# Patient Record
Sex: Male | Born: 1985 | Race: White | Hispanic: No | Smoking: Current every day smoker
Health system: Southern US, Community
[De-identification: ages and names within clinical notes are randomized; demographics above are authoritative.]

## PROBLEM LIST (undated history)

## (undated) ENCOUNTER — Ambulatory Visit

## (undated) DIAGNOSIS — K219 Gastro-esophageal reflux disease without esophagitis: Secondary | ICD-10-CM

## (undated) DIAGNOSIS — S42022A Displaced fracture of shaft of left clavicle, initial encounter for closed fracture: Secondary | ICD-10-CM

## (undated) DIAGNOSIS — F419 Anxiety disorder, unspecified: Secondary | ICD-10-CM

## (undated) DIAGNOSIS — F1290 Cannabis use, unspecified, uncomplicated: Secondary | ICD-10-CM

## (undated) DIAGNOSIS — F32A Depression, unspecified: Secondary | ICD-10-CM

## (undated) DIAGNOSIS — F17200 Nicotine dependence, unspecified, uncomplicated: Secondary | ICD-10-CM

## (undated) DIAGNOSIS — F4310 Post-traumatic stress disorder, unspecified: Secondary | ICD-10-CM

## (undated) HISTORY — PX: HAND SURGERY: SHX662

## (undated) HISTORY — DX: Displaced fracture of shaft of left clavicle, initial encounter for closed fracture: S42.022A

## (undated) HISTORY — PX: WISDOM TOOTH EXTRACTION: SHX21

---
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
## 2005-04-12 ENCOUNTER — Emergency Department (HOSPITAL_COMMUNITY): Admission: EM | Admit: 2005-04-12 | Discharge: 2005-04-12 | Payer: Self-pay | Admitting: Emergency Medicine

## 2005-09-05 ENCOUNTER — Emergency Department (HOSPITAL_COMMUNITY): Admission: EM | Admit: 2005-09-05 | Discharge: 2005-09-05 | Payer: Self-pay | Admitting: *Deleted

## 2005-12-30 ENCOUNTER — Emergency Department (HOSPITAL_COMMUNITY): Admission: EM | Admit: 2005-12-30 | Discharge: 2005-12-31 | Payer: Self-pay | Admitting: Emergency Medicine

## 2006-01-05 ENCOUNTER — Emergency Department (HOSPITAL_COMMUNITY): Admission: EM | Admit: 2006-01-05 | Discharge: 2006-01-05 | Payer: Self-pay | Admitting: Emergency Medicine

## 2006-01-16 ENCOUNTER — Emergency Department (HOSPITAL_COMMUNITY): Admission: EM | Admit: 2006-01-16 | Discharge: 2006-01-16 | Payer: Self-pay | Admitting: Emergency Medicine

## 2006-07-10 ENCOUNTER — Emergency Department (HOSPITAL_COMMUNITY): Admission: EM | Admit: 2006-07-10 | Discharge: 2006-07-10 | Payer: Self-pay | Admitting: Emergency Medicine

## 2007-01-08 ENCOUNTER — Emergency Department (HOSPITAL_COMMUNITY): Admission: EM | Admit: 2007-01-08 | Discharge: 2007-01-08 | Payer: Self-pay | Admitting: Emergency Medicine

## 2007-01-11 ENCOUNTER — Ambulatory Visit (HOSPITAL_COMMUNITY): Admission: RE | Admit: 2007-01-11 | Discharge: 2007-01-11 | Payer: Self-pay | Admitting: *Deleted

## 2008-01-26 ENCOUNTER — Emergency Department (HOSPITAL_COMMUNITY): Admission: EM | Admit: 2008-01-26 | Discharge: 2008-01-26 | Payer: Self-pay | Admitting: Emergency Medicine

## 2008-02-06 ENCOUNTER — Emergency Department (HOSPITAL_COMMUNITY): Admission: EM | Admit: 2008-02-06 | Discharge: 2008-02-06 | Payer: Self-pay | Admitting: Emergency Medicine

## 2008-03-28 ENCOUNTER — Emergency Department (HOSPITAL_COMMUNITY): Admission: EM | Admit: 2008-03-28 | Discharge: 2008-03-28 | Payer: Self-pay | Admitting: Emergency Medicine

## 2008-03-30 ENCOUNTER — Emergency Department (HOSPITAL_COMMUNITY): Admission: EM | Admit: 2008-03-30 | Discharge: 2008-03-30 | Payer: Self-pay | Admitting: Emergency Medicine

## 2008-07-21 ENCOUNTER — Emergency Department (HOSPITAL_COMMUNITY): Admission: EM | Admit: 2008-07-21 | Discharge: 2008-07-21 | Payer: Self-pay | Admitting: Emergency Medicine

## 2008-12-03 ENCOUNTER — Emergency Department (HOSPITAL_COMMUNITY): Admission: EM | Admit: 2008-12-03 | Discharge: 2008-12-03 | Payer: Self-pay | Admitting: Emergency Medicine

## 2008-12-18 ENCOUNTER — Emergency Department (HOSPITAL_COMMUNITY): Admission: EM | Admit: 2008-12-18 | Discharge: 2008-12-18 | Payer: Self-pay | Admitting: Emergency Medicine

## 2010-02-23 IMAGING — CR DG HAND COMPLETE 3+V*R*
3 series · 3 of 3 positions shown · non-contrast
Comparison: None

CLINICAL DATA: Right hand injury.

RIGHT HAND - COMPLETE 3+ VIEW

[x hand ap right]
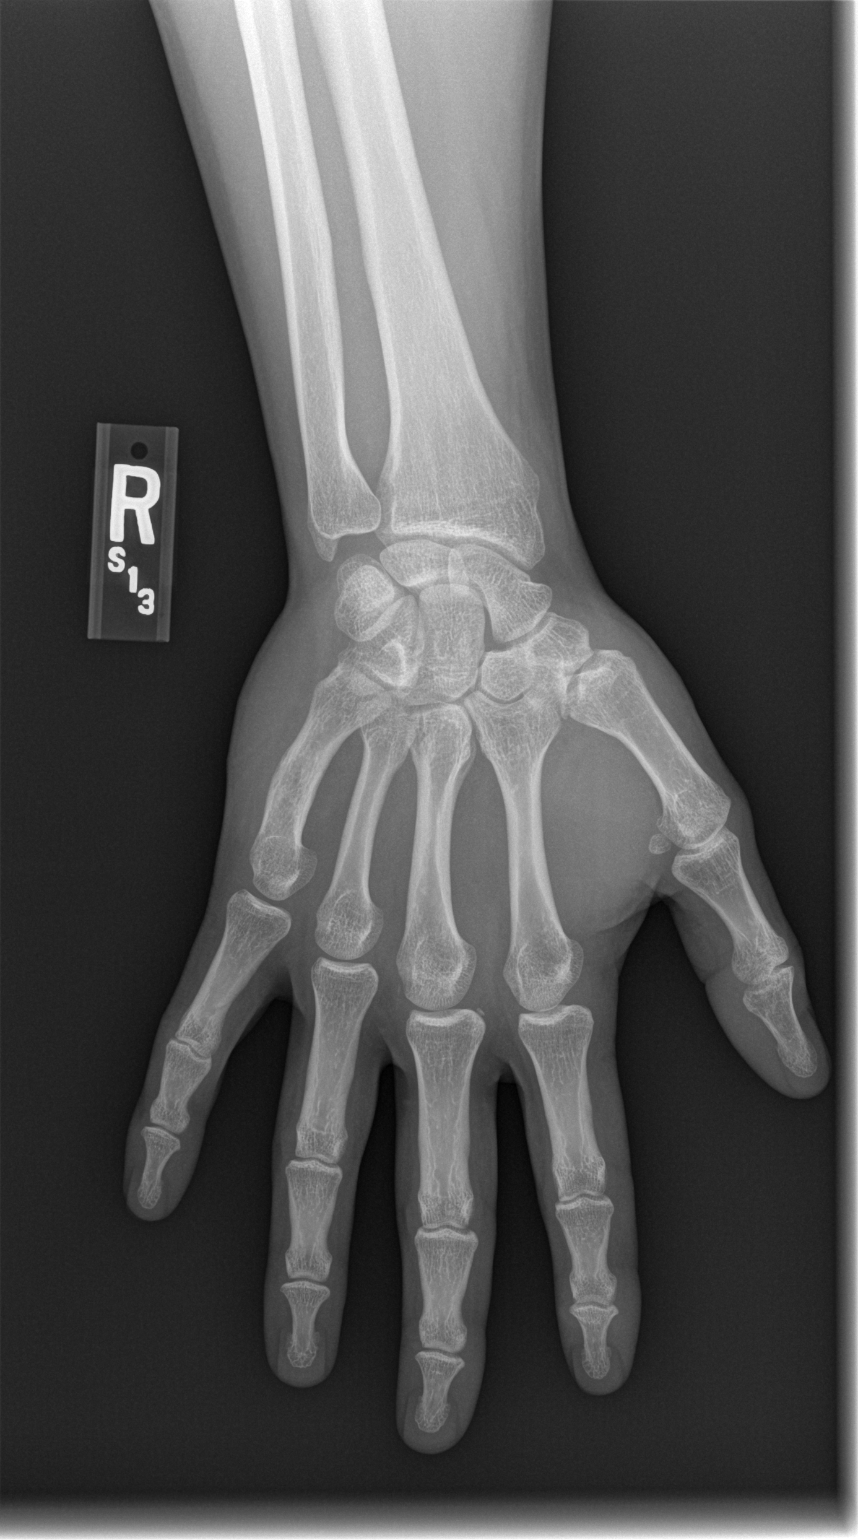

[x hand oblique right]
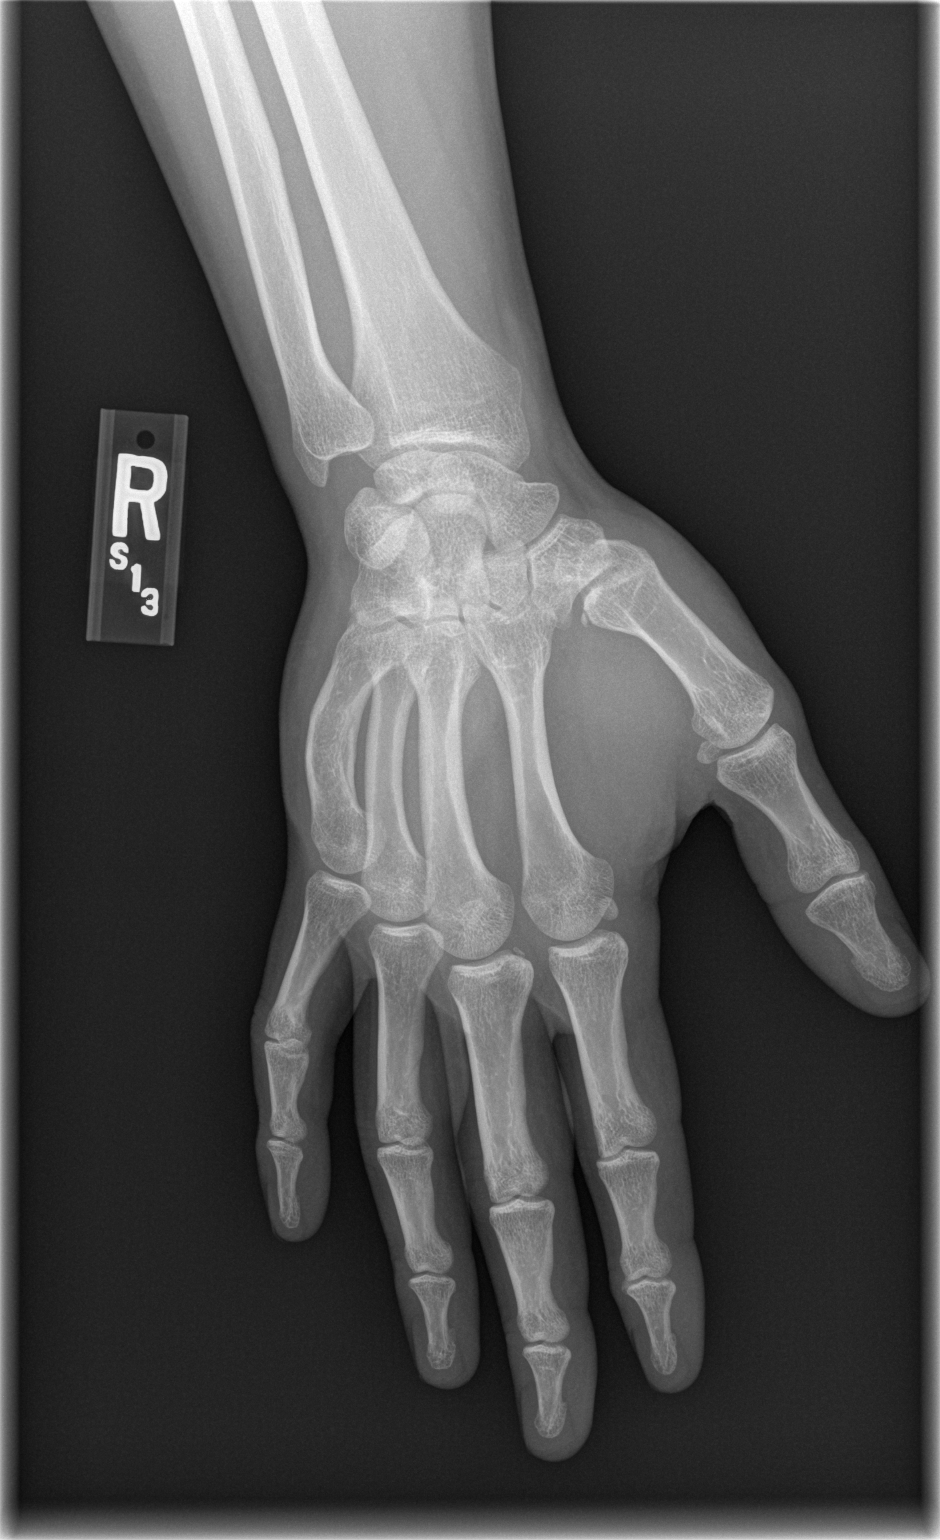

[x hand lat right]
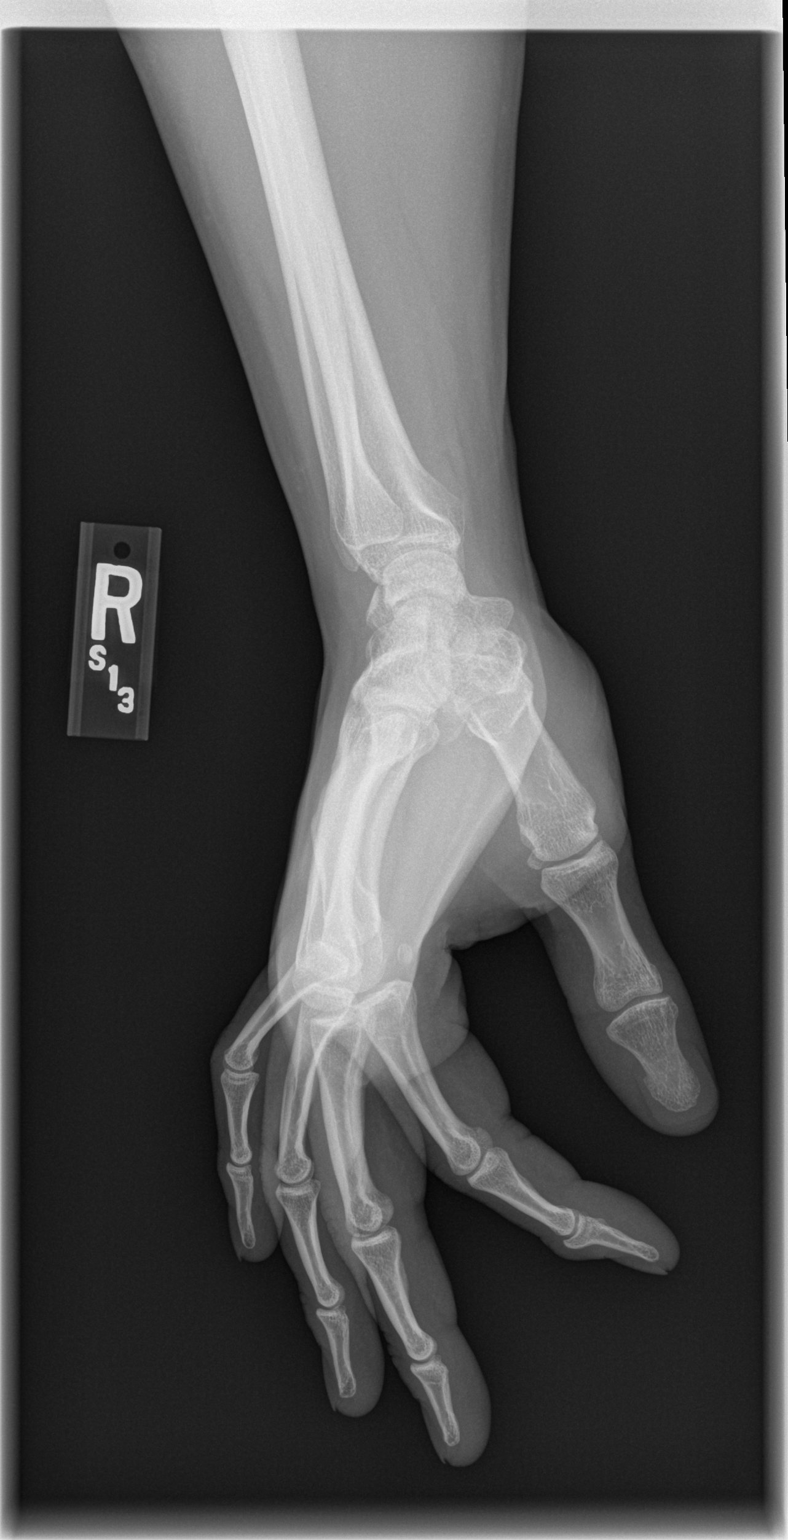

[3 of 3 positions shown; findings below may reference images not displayed]

FINDINGS: There is a remote healed fifth metacarpal fracture with
mild dorsal angulation.  The joint spaces are maintained.  There is
a small bony density at the base of the first metacarpal which it
is likely an avulsion fracture.  Recommend correlation any pain or
tenderness at the base of the thumb.  There is also a density at
the base of the proximal phalanx of the middle finger on the radial
side but this is very smooth in appearance and may be an old
avulsion injury or unfused secondary ossification center.  The
carpal bones are intact.
IMPRESSION: 1.  Avulsion fracture at the base of the first metacarpal.
2.  Remote healed fifth metacarpal fracture.

## 2010-10-07 ENCOUNTER — Emergency Department (HOSPITAL_COMMUNITY)
Admission: EM | Admit: 2010-10-07 | Discharge: 2010-10-07 | Disposition: A | Discharge status: Home / Self Care | Payer: Self-pay | Attending: Emergency Medicine | Admitting: Emergency Medicine

## 2010-10-07 ENCOUNTER — Emergency Department (HOSPITAL_COMMUNITY): Payer: Self-pay

## 2010-10-07 DIAGNOSIS — S8000XA Contusion of unspecified knee, initial encounter: Secondary | ICD-10-CM | POA: Insufficient documentation

## 2010-10-07 DIAGNOSIS — M25569 Pain in unspecified knee: Secondary | ICD-10-CM | POA: Insufficient documentation

## 2010-10-07 DIAGNOSIS — M79609 Pain in unspecified limb: Secondary | ICD-10-CM | POA: Insufficient documentation

## 2010-10-07 DIAGNOSIS — W1809XA Striking against other object with subsequent fall, initial encounter: Secondary | ICD-10-CM | POA: Insufficient documentation

## 2010-10-11 ENCOUNTER — Emergency Department (HOSPITAL_COMMUNITY)
Admission: EM | Admit: 2010-10-11 | Discharge: 2010-10-12 | Disposition: A | Discharge status: Home / Self Care | Payer: Self-pay | Attending: Emergency Medicine | Admitting: Emergency Medicine

## 2010-10-11 ENCOUNTER — Emergency Department (HOSPITAL_COMMUNITY): Payer: Self-pay

## 2010-10-11 DIAGNOSIS — S8000XA Contusion of unspecified knee, initial encounter: Secondary | ICD-10-CM | POA: Insufficient documentation

## 2010-10-11 DIAGNOSIS — W1809XA Striking against other object with subsequent fall, initial encounter: Secondary | ICD-10-CM | POA: Insufficient documentation

## 2010-10-11 DIAGNOSIS — IMO0002 Reserved for concepts with insufficient information to code with codable children: Secondary | ICD-10-CM | POA: Insufficient documentation

## 2010-11-03 NOTE — Op Note (Signed)
[name], [name]            ACCOUNT NO.:  [id number]   MEDICAL RECORD NO.:  [id number]          PATIENT TYPE:  AMB   LOCATION:  SDS                          FACILITY:  [facility]   PHYSICIAN:  Tennis Must Meyerdierks, M.D.DATE OF BIRTH:  18-Sep-1985   DATE OF PROCEDURE:  01/11/2007  DATE OF DISCHARGE:                               OPERATIVE REPORT   PREOPERATIVE DIAGNOSIS:  Bennett fracture, right thumb.   POSTOPERATIVE DIAGNOSIS:  Bennett fracture, right thumb.   PROCEDURE:  Closed reduction and percutaneous pinning of Bennett  fracture, right thumb.   SURGEON:  Lowell Bouton, M.D.   ANESTHESIA:  General.   OPERATIVE FINDINGS:  The patient had a minimally displaced fracture at  the base of the first metacarpal.  It was an intra-articular Bennett  fracture.   PROCEDURE IN DETAIL:  Under general anesthesia, the right hand was  prepped and draped in the usual fashion and the mini C-arm was draped.  The right hand was placed on the C-arm and a 0.45 K-wire was placed in  the proximal end of the first metacarpal.  The fracture was then reduced  and the K-wire was placed across the carpometacarpal joint.  X-rays  showed good position of the fracture.  A second 0.45 K-wire was then  placed adjacent to the first to prevent rotation and was placed across  the [facility] joint.  The pins were bent over and left protruding from the  skin.  Sterile dressings were applied followed by a thumb spica splint.  The patient tolerated the procedure well and went to the recovery room  awake in stable condition.      Lowell Bouton, M.D.  Electronically Signed     EMM/MEDQ  D:  01/11/2007  T:  01/11/2007  Job:  [id number]

## 2011-01-01 ENCOUNTER — Emergency Department (HOSPITAL_COMMUNITY)
Admission: EM | Admit: 2011-01-01 | Discharge: 2011-01-01 | Disposition: A | Discharge status: Home / Self Care | Payer: Self-pay | Attending: Emergency Medicine | Admitting: Emergency Medicine

## 2011-01-01 DIAGNOSIS — W268XXA Contact with other sharp object(s), not elsewhere classified, initial encounter: Secondary | ICD-10-CM | POA: Insufficient documentation

## 2011-01-01 DIAGNOSIS — M7989 Other specified soft tissue disorders: Secondary | ICD-10-CM | POA: Insufficient documentation

## 2011-01-01 DIAGNOSIS — S61409A Unspecified open wound of unspecified hand, initial encounter: Secondary | ICD-10-CM | POA: Insufficient documentation

## 2011-01-09 ENCOUNTER — Emergency Department (HOSPITAL_COMMUNITY)
Admission: EM | Admit: 2011-01-09 | Discharge: 2011-01-09 | Disposition: A | Discharge status: Home / Self Care | Payer: Self-pay | Attending: Emergency Medicine | Admitting: Emergency Medicine

## 2011-01-09 DIAGNOSIS — Z4802 Encounter for removal of sutures: Secondary | ICD-10-CM | POA: Insufficient documentation

## 2011-04-05 LAB — CBC
Hemoglobin: 13.5
RBC: 4.53

## 2011-11-29 ENCOUNTER — Emergency Department (HOSPITAL_COMMUNITY): Payer: Self-pay

## 2011-11-29 ENCOUNTER — Encounter (HOSPITAL_COMMUNITY): Payer: Self-pay | Admitting: Emergency Medicine

## 2011-11-29 ENCOUNTER — Emergency Department (HOSPITAL_COMMUNITY)
Admission: EM | Admit: 2011-11-29 | Discharge: 2011-11-29 | Disposition: A | Discharge status: Home / Self Care | Payer: Self-pay | Attending: Emergency Medicine | Admitting: Emergency Medicine

## 2011-11-29 DIAGNOSIS — J3489 Other specified disorders of nose and nasal sinuses: Secondary | ICD-10-CM | POA: Insufficient documentation

## 2011-11-29 DIAGNOSIS — J329 Chronic sinusitis, unspecified: Secondary | ICD-10-CM

## 2011-11-29 DIAGNOSIS — R070 Pain in throat: Secondary | ICD-10-CM | POA: Insufficient documentation

## 2011-11-29 DIAGNOSIS — R062 Wheezing: Secondary | ICD-10-CM | POA: Insufficient documentation

## 2011-11-29 DIAGNOSIS — R51 Headache: Secondary | ICD-10-CM | POA: Insufficient documentation

## 2011-11-29 DIAGNOSIS — IMO0001 Reserved for inherently not codable concepts without codable children: Secondary | ICD-10-CM | POA: Insufficient documentation

## 2011-11-29 DIAGNOSIS — R05 Cough: Secondary | ICD-10-CM | POA: Insufficient documentation

## 2011-11-29 DIAGNOSIS — R059 Cough, unspecified: Secondary | ICD-10-CM | POA: Insufficient documentation

## 2011-11-29 DIAGNOSIS — J40 Bronchitis, not specified as acute or chronic: Secondary | ICD-10-CM

## 2011-11-29 MED ORDER — IPRATROPIUM BROMIDE 0.02 % IN SOLN: 0.5000 mg | Freq: Once | RESPIRATORY_TRACT | Status: DC

## 2011-11-29 MED ORDER — FLUTICASONE PROPIONATE 50 MCG/ACT NA SUSP: 2.0000 | Freq: Every day | NASAL | Status: DC

## 2011-11-29 MED ORDER — OXYMETAZOLINE HCL 0.05 % NA SOLN
1.0000 | Freq: Once | NASAL | Status: AC
  Administered 2011-11-29: 1 via NASAL
  Filled 2011-11-29: qty 15

## 2011-11-29 MED ORDER — ALBUTEROL SULFATE (5 MG/ML) 0.5% IN NEBU
5.0000 mg | INHALATION_SOLUTION | Freq: Once | RESPIRATORY_TRACT | Status: AC
  Administered 2011-11-29: 5 mg via RESPIRATORY_TRACT
  Filled 2011-11-29: qty 1

## 2011-11-29 MED ORDER — PREDNISONE 20 MG PO TABS
60.0000 mg | ORAL_TABLET | Freq: Once | ORAL | Status: AC
  Administered 2011-11-29: 60 mg via ORAL
  Filled 2011-11-29: qty 3

## 2011-11-29 MED ORDER — ALBUTEROL SULFATE (5 MG/ML) 0.5% IN NEBU: 5.0000 mg | INHALATION_SOLUTION | Freq: Once | RESPIRATORY_TRACT | Status: DC

## 2011-11-29 MED ORDER — ALBUTEROL SULFATE HFA 108 (90 BASE) MCG/ACT IN AERS
2.0000 | INHALATION_SPRAY | Freq: Once | RESPIRATORY_TRACT | Status: AC
  Administered 2011-11-29: 2 via RESPIRATORY_TRACT
  Filled 2011-11-29: qty 6.7

## 2011-11-29 MED ORDER — PREDNISONE (PAK) 10 MG PO TABS: ORAL_TABLET | ORAL | Status: AC

## 2011-11-29 NOTE — Discharge Instructions (Signed)
Albuterol inhaler:  1-2 puffs every 4-6 hours  Ibuprofen: up to 800mg  every 6-8 hours with food for fever, chills and body pains  How to clear your sinuses:  Use Afrin (oxymetazoline) (and over-the-counter nasal decongestant spray): 1-2 squirts in each nostril. Wait 10 minutes (while using either warm compresses to face, a humidifier, or taking a hot shower) Then do 2 squirts of fluticasone (prescription)  in each nostril. Do this for 1-2 weeks and that should help significantly with sinus congestion.     Bronchitis Bronchitis is the body's way of reacting to injury and/or infection (inflammation) of the bronchi. Bronchi are the air tubes that extend from the windpipe into the lungs. If the inflammation becomes severe, it may cause shortness of breath. CAUSES  Inflammation may be caused by:  A virus.   Germs (bacteria).   Dust.   Allergens.   Pollutants and many other irritants.  The cells lining the bronchial tree are covered with tiny hairs (cilia). These constantly beat upward, away from the lungs, toward the mouth. This keeps the lungs free of pollutants. When these cells become too irritated and are unable to do their job, mucus begins to develop. This causes the characteristic cough of bronchitis. The cough clears the lungs when the cilia are unable to do their job. Without either of these protective mechanisms, the mucus would settle in the lungs. Then you would develop pneumonia. Smoking is a common cause of bronchitis and can contribute to pneumonia. Stopping this habit is the single most important thing you can do to help yourself. TREATMENT   Medicines that open up your airways make it easier to breathe. Your caregiver may also recommend or prescribe an expectorant. It will loosen the mucus to be coughed up. Only take over-the-counter or prescription medicines for pain, discomfort, or fever as directed by your caregiver.   Removing whatever causes the problem (smoking, for  example) is critical to preventing the problem from getting worse.   Cough suppressants may be prescribed for relief of cough symptoms.   Inhaled medicines may be prescribed to help with symptoms now and to help prevent problems from returning.   For those with recurrent (chronic) bronchitis, there may be a need for steroid medicines.  SEEK IMMEDIATE MEDICAL CARE IF:   During treatment, you develop more pus-like mucus (purulent sputum).   You have a fever.   Your baby is older than 3 months with a rectal temperature of 102 F (38.9 C) or higher.   Your baby is 59 months old or younger with a rectal temperature of 100.4 F (38 C) or higher.   You become progressively more ill.   You have increased difficulty breathing, wheezing, or shortness of breath.  It is necessary to seek immediate medical care if you are elderly or sick from any other disease. MAKE SURE YOU:   Understand these instructions.   Will watch your condition.   Will get help right away if you are not doing well or get worse.  Document Released: 06/07/2005 Document Revised: 05/27/2011 Document Reviewed: 04/16/2008 [facility] Patient Information 2012 [place], [place].  Sinusitis Sinuses are air pockets within the bones of your face. The growth of bacteria within a sinus leads to infection. The infection prevents the sinuses from draining. This infection is called sinusitis. SYMPTOMS  There will be different areas of pain depending on which sinuses have become infected.  The maxillary sinuses often produce pain beneath the eyes.   Frontal sinusitis may cause pain  in the middle of the forehead and above the eyes.  Other problems (symptoms) include:  Toothaches.   Colored, pus-like (purulent) drainage from the nose.   Swelling, warmth, and tenderness over the sinus areas may be signs of infection.  TREATMENT  Sinusitis is most often determined by an exam. A majority of infections are caused by a virus and  usually require symptomatic treatment. If you continue to have symptoms including a fever of 100.4 or higher for > 2 weeks please return to the for evaluation of a possible bacterial infection   HOME CARE INSTRUCTIONS   Only take over-the-counter or prescription medicines for pain, discomfort, or fever as directed by your caregiver.   Drink extra fluids. Fluids help thin the mucus so your sinuses can drain more easily.   Applying either moist heat or ice packs to the sinus areas may help relieve discomfort.   Use saline nasal sprays to help moisten your sinuses. The sprays can be found at your local drugstore.  SEEK IMMEDIATE MEDICAL CARE IF:  You have a fever.   You have increasing pain, severe headaches, or toothache.   You have nausea, vomiting, or drowsiness.   You develop unusual swelling around the face or trouble seeing.  MAKE SURE YOU:   Understand these instructions.   Will watch your condition.   Will get help right away if you are not doing well or get worse.  Document Released: 06/07/2005 Document Revised: 05/27/2011 Document Reviewed: 01/04/2007 [facility] Patient Information 2012 [place], [place].  Antibiotic Nonuse  Your caregiver felt that the infection or problem was not one that would be helped with an antibiotic. Infections may be caused by viruses or bacteria. Only a caregiver can tell which one of these is the likely cause of an illness. A cold is the most common cause of infection in both adults and children. A cold is a virus. Antibiotic treatment will have no effect on a viral infection. Viruses can lead to many lost days of work caring for sick children and many missed days of school. Children may catch as many as 10 "colds" or "flus" per year during which they can be tearful, cranky, and uncomfortable. The goal of treating a virus is aimed at keeping the ill person comfortable. Antibiotics are medications used to help the body fight bacterial infections. There  are relatively few types of bacteria that cause infections but there are hundreds of viruses. While both viruses and bacteria cause infection they are very different types of germs. A viral infection will typically go away by itself within 7 to 10 days. Bacterial infections may spread or get worse without antibiotic treatment. Examples of bacterial infections are:  Sore throats (like strep throat or tonsillitis).   Infection in the lung (pneumonia).   Ear and skin infections.  Examples of viral infections are:  Colds or flus.   Most coughs and bronchitis.   Sore throats not caused by Strep.   Runny noses.  It is often best not to take an antibiotic when a viral infection is the cause of the problem. Antibiotics can kill off the helpful bacteria that we have inside our body and allow harmful bacteria to start growing. Antibiotics can cause side effects such as allergies, nausea, and diarrhea without helping to improve the symptoms of the viral infection. Additionally, repeated uses of antibiotics can cause bacteria inside of our body to become resistant. That resistance can be passed onto harmful bacterial. The next time you have an infection  it may be harder to treat if antibiotics are used when they are not needed. Not treating with antibiotics allows our own immune system to develop and take care of infections more efficiently. Also, antibiotics will work better for [place] when they are prescribed for bacterial infections. Treatments for a child that is ill may include:  Give extra fluids throughout the day to stay hydrated.   Get plenty of rest.   Only give your child over-the-counter or prescription medicines for pain, discomfort, or fever as directed by your caregiver.   The use of a cool mist humidifier may help stuffy noses.   Cold medications if suggested by your caregiver.  Your caregiver may decide to start you on an antibiotic if:  The problem you were seen for today continues  for a longer length of time than expected.   You develop a secondary bacterial infection.  SEEK MEDICAL CARE IF:  Fever lasts longer than 5 days.   Symptoms continue to get worse after 5 to 7 days or become severe.   Difficulty in breathing develops.   Signs of dehydration develop (poor drinking, rare urinating, dark colored urine).   Changes in behavior or worsening tiredness (listlessness or lethargy).  Document Released: 08/16/2001 Document Revised: 05/27/2011 Document Reviewed: 02/12/2009 [facility] Patient Information 2012 [place], [place].

## 2011-11-29 NOTE — ED Notes (Signed)
Pt alert, nad, c/o URI ss, onset several days ago, resp even unlabored, skin pwd

## 2011-11-29 NOTE — ED Provider Notes (Signed)
History     CSN: [id number]  Arrival date & time 11/29/11  0555   First MD Initiated Contact with Patient 11/29/11 0616      6:26 AM HPI Reports 3 days of URI sxs. States no improvement with rest. Symptoms include nasal congestion, sinus pressure, cough, wheezing, sore throat, chills and myalgias. Denies fever, CP, SOB. Denies sick contacts, foreign travel or h/o asthma. Patient is a 26 y.o. male presenting with URI. The history is provided by the patient.  URI The primary symptoms include headaches, sore throat, cough, wheezing and myalgias. Primary symptoms do not include fever, ear pain, abdominal pain, nausea or vomiting. Episode onset: 3 days ago. This is a new problem. The problem has been gradually worsening.  The headache is not associated with neck stiffness or weakness.  The myalgias are not associated with weakness.  Symptoms associated with the illness include chills, facial pain, sinus pressure, congestion and rhinorrhea.    History reviewed. No pertinent past medical history.  History reviewed. No pertinent past surgical history.  No family history on file.  History  Substance Use Topics  . Smoking status: Current Everyday Smoker -- 1.0 packs/day    Types: Cigarettes  . Smokeless tobacco: Not on file  . Alcohol Use: No      Review of Systems  Constitutional: Positive for chills. Negative for fever.  HENT: Positive for congestion, sore throat, rhinorrhea and sinus pressure. Negative for ear pain, neck pain and neck stiffness.   Respiratory: Positive for cough and wheezing. Negative for shortness of breath.   Cardiovascular: Negative for chest pain.  Gastrointestinal: Negative for nausea, vomiting, abdominal pain and diarrhea.  Genitourinary: Negative for dysuria, hematuria and flank pain.  Musculoskeletal: Positive for myalgias.  Neurological: Positive for headaches. Negative for dizziness, speech difficulty, weakness and numbness.  All other systems reviewed  and are negative.    Allergies  Peanut-containing drug products  Home Medications  No current outpatient prescriptions on file.  BP 134/72  Pulse 81  Temp(Src) 97.8 F (36.6 C) (Oral)  Resp 16  Wt 180 lb (81.647 kg)  SpO2 94%  Physical Exam  Vitals reviewed. Constitutional: He is oriented to person, place, and time. He appears well-developed and well-nourished.  HENT:  Head: Normocephalic and atraumatic.  Right Ear: Tympanic membrane, external ear and ear canal normal.  Left Ear: Tympanic membrane, external ear and ear canal normal.  Nose: No mucosal edema or rhinorrhea. Right sinus exhibits maxillary sinus tenderness and frontal sinus tenderness. Left sinus exhibits maxillary sinus tenderness and frontal sinus tenderness.  Mouth/Throat: Oropharynx is clear and moist. No oropharyngeal exudate.  Eyes: Conjunctivae are normal. Pupils are equal, round, and reactive to light. Right eye exhibits no discharge. Left eye exhibits no discharge.  Neck: Normal range of motion. Neck supple.  Cardiovascular: Normal rate, regular rhythm and normal heart sounds.  Exam reveals no gallop and no friction rub.   No murmur heard. Pulmonary/Chest: Effort normal. He has wheezes (Diffuse left lung  and RLL).  Lymphadenopathy:    He has no cervical adenopathy.  Neurological: He is alert and oriented to person, place, and time.  Skin: Skin is warm and dry. No rash noted. No erythema. No pallor.  Psychiatric: He has a normal mood and affect. His behavior is normal.    ED Course  Procedures   Dg Chest 2 View  11/29/2011  *RADIOLOGY REPORT*  Clinical Data: Productive cough, shortness of breath.  CHEST - 2 VIEW  Comparison: 12/03/2008  Findings: Lungs are clear. No pleural effusion or pneumothorax. The cardiomediastinal contours are within normal limits. The visualized bones and soft tissues are without significant appreciable abnormality.  IMPRESSION: No radiographic evidence of acute cardiopulmonary  process.  Original Report Authenticated By: Waneta Martins, M.D.    MDM   7:25 AM Patient reassessed. Lungs are now CTA. Reports Afrin has significantly improved nasal congestion. CXR neg. Will d/c with fluticasone and afrin regime to help clear sinuses. Also will Rx prednisone and give albuterol inhaler for Bronchitis. Advised ibuprofen for fever, chills and myalgias. Patient voices understanding and is ready for d/c      Thomasene Lot, PA-C 11/29/11 4540

## 2011-11-29 NOTE — ED Provider Notes (Signed)
Medical screening examination/treatment/procedure(s) were performed by non-physician practitioner and as supervising physician I was immediately available for consultation/collaboration.  Olivia Mackie, MD 11/29/11 [phone]

## 2012-02-23 ENCOUNTER — Emergency Department (HOSPITAL_COMMUNITY): Payer: Self-pay

## 2012-02-23 ENCOUNTER — Encounter (HOSPITAL_COMMUNITY): Payer: Self-pay | Admitting: Emergency Medicine

## 2012-02-23 ENCOUNTER — Emergency Department (HOSPITAL_COMMUNITY)
Admission: EM | Admit: 2012-02-23 | Discharge: 2012-02-23 | Disposition: A | Discharge status: Home / Self Care | Payer: Self-pay | Attending: Emergency Medicine | Admitting: Emergency Medicine

## 2012-02-23 DIAGNOSIS — F17200 Nicotine dependence, unspecified, uncomplicated: Secondary | ICD-10-CM | POA: Insufficient documentation

## 2012-02-23 DIAGNOSIS — J069 Acute upper respiratory infection, unspecified: Secondary | ICD-10-CM | POA: Insufficient documentation

## 2012-02-23 MED ORDER — IBUPROFEN 800 MG PO TABS
800.0000 mg | ORAL_TABLET | Freq: Once | ORAL | Status: AC
  Administered 2012-02-23: 800 mg via ORAL
  Filled 2012-02-23: qty 1

## 2012-02-23 MED ORDER — OXYMETAZOLINE HCL 0.05 % NA SOLN
1.0000 | Freq: Once | NASAL | Status: AC
  Administered 2012-02-23: 1 via NASAL
  Filled 2012-02-23: qty 15

## 2012-02-23 MED ORDER — ACETAMINOPHEN-CODEINE 120-12 MG/5ML PO SUSP: 5.0000 mL | Freq: Four times a day (QID) | ORAL | Status: AC | PRN

## 2012-02-23 MED ORDER — ALBUTEROL SULFATE HFA 108 (90 BASE) MCG/ACT IN AERS: 1.0000 | INHALATION_SPRAY | Freq: Four times a day (QID) | RESPIRATORY_TRACT | Status: DC | PRN

## 2012-02-23 MED ORDER — ALBUTEROL SULFATE HFA 108 (90 BASE) MCG/ACT IN AERS
2.0000 | INHALATION_SPRAY | RESPIRATORY_TRACT | Status: DC | PRN
  Administered 2012-02-23: 2 via RESPIRATORY_TRACT
  Filled 2012-02-23: qty 6.7

## 2012-02-23 MED ORDER — IBUPROFEN 800 MG PO TABS: 800.0000 mg | ORAL_TABLET | Freq: Three times a day (TID) | ORAL | Status: AC

## 2012-02-23 NOTE — ED Provider Notes (Signed)
History     CSN: [id number]  Arrival date & time 02/23/12  0405   First MD Initiated Contact with Patient 02/23/12 [phone]      Chief Complaint  Patient presents with  . Shortness of Breath    (Consider location/radiation/quality/duration/timing/severity/associated sxs/prior treatment) HPI HX per PT, sick the last few days with cough, congestion and mild sore throat, no fevers or chills, no N/V, no rash, sig other at home sick last week with the same symptoms and now has lingering cough. Taking mucinex with minimal relief, is a smoker, no wheezes. Mod in severity History reviewed. No pertinent past medical history.  Past Surgical History  Procedure Date  . Hand surgery     Family History  Problem Relation Age of Onset  . Coronary artery disease Other   . Cancer Other     History  Substance Use Topics  . Smoking status: Current Everyday Smoker -- 1.0 packs/day    Types: Cigarettes  . Smokeless tobacco: Not on file  . Alcohol Use: No      Review of Systems  Constitutional: Negative for fever and chills.  HENT: Positive for congestion. Negative for neck pain and neck stiffness.   Eyes: Negative for pain.  Respiratory: Positive for cough. Negative for shortness of breath.   Cardiovascular: Negative for chest pain.  Gastrointestinal: Negative for abdominal pain.  Genitourinary: Negative for dysuria.  Musculoskeletal: Negative for back pain.  Skin: Negative for rash.  Neurological: Negative for headaches.  All other systems reviewed and are negative.    Allergies  Peanut-containing drug products  Home Medications   Current Outpatient Rx  Name Route Sig Dispense Refill  . FLUTICASONE PROPIONATE 50 MCG/ACT NA SUSP Nasal Place 2 sprays into the nose daily.    . GUAIFENESIN ER 600 MG PO TB12 Oral Take 1,200 mg by mouth 2 (two) times daily.      BP 122/81  Pulse 78  Temp 97.9 F (36.6 C) (Oral)  SpO2 97%  Physical Exam  Constitutional: He is oriented to  person, place, and time. He appears well-developed and well-nourished.  HENT:  Head: Normocephalic and atraumatic.  Eyes: Conjunctivae and EOM are normal. Pupils are equal, round, and reactive to light.  Neck: Trachea normal. Neck supple.  Cardiovascular: Normal rate, regular rhythm, S1 normal, S2 normal and normal pulses.     No systolic murmur is present   No diastolic murmur is present  Pulses:      Radial pulses are 2+ on the right side, and 2+ on the left side.  Pulmonary/Chest: Effort normal. No stridor. No respiratory distress. He has no rhonchi.       Mildly dec bilat breath sounds  Abdominal: Soft. Normal appearance and bowel sounds are normal. There is no tenderness. There is no CVA tenderness and negative Murphy's sign.  Musculoskeletal:       BLE:s Calves nontender, no cords or erythema, negative Homans sign  Neurological: He is alert and oriented to person, place, and time. He has normal strength. No cranial nerve deficit or sensory deficit. GCS eye subscore is 4. GCS verbal subscore is 5. GCS motor subscore is 6.  Skin: Skin is warm and dry. No rash noted. He is not diaphoretic.  Psychiatric: His speech is normal.       Cooperative and appropriate    ED Course  Procedures (including critical care time)  Albuterol and afrin provided.   Pulse ox 97% RA is adequate  CXR obtained/ reviewed - no  acute process identified  Clinical URI, plan tylenol with codeine, motrin, afrin and albuterol as needed.   URI precautions verbalized as understood.   MDM    VS and Nurisng notes reviewed. CXR. Medications provided.          Sunnie Nielsen, MD 02/23/12 [phone]

## 2012-02-23 NOTE — ED Notes (Addendum)
Pt states he has been sick for 3 to 4 days  Pt states it started off with a cough and runny nose and has progressively gotten worse  Pt states now it is hard for him to breathe esp when he lays down

## 2013-11-15 ENCOUNTER — Encounter (HOSPITAL_COMMUNITY): Payer: Self-pay | Admitting: Emergency Medicine

## 2013-11-15 ENCOUNTER — Emergency Department (HOSPITAL_COMMUNITY)
Admission: EM | Admit: 2013-11-15 | Discharge: 2013-11-15 | Disposition: A | Discharge status: Home / Self Care | Payer: Self-pay | Attending: Emergency Medicine | Admitting: Emergency Medicine

## 2013-11-15 DIAGNOSIS — K0889 Other specified disorders of teeth and supporting structures: Secondary | ICD-10-CM

## 2013-11-15 DIAGNOSIS — K089 Disorder of teeth and supporting structures, unspecified: Secondary | ICD-10-CM | POA: Insufficient documentation

## 2013-11-15 DIAGNOSIS — F17200 Nicotine dependence, unspecified, uncomplicated: Secondary | ICD-10-CM | POA: Insufficient documentation

## 2013-11-15 DIAGNOSIS — IMO0002 Reserved for concepts with insufficient information to code with codable children: Secondary | ICD-10-CM | POA: Insufficient documentation

## 2013-11-15 DIAGNOSIS — K029 Dental caries, unspecified: Secondary | ICD-10-CM | POA: Insufficient documentation

## 2013-11-15 DIAGNOSIS — Z79899 Other long term (current) drug therapy: Secondary | ICD-10-CM | POA: Insufficient documentation

## 2013-11-15 MED ORDER — TRAMADOL HCL 50 MG PO TABS
50.0000 mg | ORAL_TABLET | Freq: Once | ORAL | Status: AC
  Administered 2013-11-15: 50 mg via ORAL
  Filled 2013-11-15: qty 1

## 2013-11-15 MED ORDER — TRAMADOL HCL 50 MG PO TABS: 50.0000 mg | ORAL_TABLET | Freq: Four times a day (QID) | ORAL | Status: DC | PRN

## 2013-11-15 MED ORDER — PENICILLIN V POTASSIUM 500 MG PO TABS: 500.0000 mg | ORAL_TABLET | Freq: Four times a day (QID) | ORAL | Status: AC

## 2013-11-15 NOTE — Discharge Instructions (Signed)
Dental Pain °A tooth ache may be caused by cavities (tooth decay). Cavities expose the nerve of the tooth to air and hot or cold temperatures. It may come from an infection or abscess (also called a boil or furuncle) around your tooth. It is also often caused by dental caries (tooth decay). This causes the pain you are having. °DIAGNOSIS  °Your caregiver can diagnose this problem by exam. °TREATMENT  °· If caused by an infection, it may be treated with medications which kill germs (antibiotics) and pain medications as prescribed by your caregiver. Take medications as directed. °· Only take over-the-counter or prescription medicines for pain, discomfort, or fever as directed by your caregiver. °· Whether the tooth ache today is caused by infection or dental disease, you should see your dentist as soon as possible for further care. °SEEK MEDICAL CARE IF: °The exam and treatment you received today has been provided on an emergency basis only. This is not a substitute for complete medical or dental care. If your problem worsens or new problems (symptoms) appear, and you are unable to meet with your dentist, call or return to this location. °SEEK IMMEDIATE MEDICAL CARE IF:  °· You have a fever. °· You develop redness and swelling of your face, jaw, or neck. °· You are unable to open your mouth. °· You have severe pain uncontrolled by pain medicine. °MAKE SURE YOU:  °· Understand these instructions. °· Will watch your condition. °· Will get help right away if you are not doing well or get worse. °Document Released: 06/07/2005 Document Revised: 08/30/2011 Document Reviewed: 01/24/2008 °ExitCare® Patient Information ©2014 [employer]. °  Emergency Department Resource Guide °1) Find a Doctor and Pay Out of Pocket °Although you won't have to find out who is covered by your insurance plan, it is a good idea to ask around and get recommendations. You will then need to call the office and see if the doctor you have chosen will  accept you as a new patient and what types of options they offer for patients who are self-pay. Some doctors offer discounts or will set up payment plans for their patients who do not have insurance, but you will need to ask so you aren't surprised when you get to your appointment. ° °2) Contact Your Local Health Department °Not all health departments have doctors that can see patients for sick visits, but many do, so it is worth a call to see if yours does. If you don't know where your local health department is, you can check in your phone book. The CDC also has a tool to help you locate your state's health department, and many state websites also have listings of all of their local health departments. ° °3) Find a Walk-in Clinic °If your illness is not likely to be very severe or complicated, you may want to try a walk in clinic. These are popping up all over the country in pharmacies, drugstores, and shopping centers. They're usually staffed by nurse practitioners or physician assistants that have been trained to treat common illnesses and complaints. They're usually fairly quick and inexpensive. However, if you have serious medical issues or chronic medical problems, these are probably not your best option. ° °No Primary Care Doctor: °- Call [facility] at  832-8000 - they can help you locate a primary care doctor that  accepts your insurance, provides certain services, etc. °- Physician Referral Service- 1-800-533-3463 ° °Chronic Pain Problems: °Organization           Address  Phone   Notes  °[facility]  (336) 297-2271 Patients need to be referred by their primary care doctor.  ° °Medication Assistance: °Organization         Address  Phone   Notes  °[place] [facility] [address] °[place], [place] [address] (336) 641-8030 --Must be a resident of [place] °-- Must have NO insurance coverage whatsoever (no Medicaid/ Medicare, etc.) °-- The pt.  MUST have a primary care doctor that directs their care regularly and follows them in the community °  °[facility]  (866) 331-1348   °[employer]  (888) 892-1162   ° °Agencies that provide inexpensive medical care: °Organization         Address  Phone   Notes  °[facility]  (336) 832-8035   °[facility]    (336) 832-7272   °[facility] [address] °[place], [place] [address] (336) 832-4777   °[facility] [address], °[place] (336) 271-4999   °[facility]    (336) 373-0678   °[facility]    (336) 272-1050   °[facility] ° [address], [place] Phone:  (336) 832-4444, Fax:  (336) 832-4440 Hours of Operation:  9 am - 6 pm, M-F.  Also accepts Medicaid/Medicare and self-pay.  °[facility] ° [address], [place] Phone: (336) 832-3150, Fax: (336) 832-3151. Hours of Operation:  8:30 am - 5:30 pm, M-F.  Also accepts Medicaid and self-pay.  °[facility] [place] [address], [place] Phone: (336) 878-6027   °[facility] [address], [place], [place] (336)723-1848, Ext. 123 Mondays & Thursdays: 7-9 AM.  First 15 patients are seen on a first come, first serve basis. °  ° °Medicaid-accepting [place] Providers: ° °Organization         Address  Phone   Notes  °[facility] [address], [place] (336) 641-2100 Also accepts self-pay patients.  °[facility] [address], [place] ° (336) 856-9996   °[facility] [address], [place] (336) 288-8857   °[facility] [address] [place] [address], [place] (336) 299-7000   °Veita Bland [address], [place]  ° (336) 373-1557 Only accepts [place] Access Medicaid patients after they have their name applied to their card.  ° °Self-Pay (no insurance) in  [place]: ° °Organization         Address  Phone   Notes  °Sickle Cell Patients, [facility] [address], [place] (336) 832-1970   °[facility] [address], [place] (336) 832-4400   °[facility] ° [address], [place] (336) 992-4800   °[facility]/Dr. Osei-Bonsu ° [address] [place] [address], [place] or [address], [place] (336) 841-8500 Phone number for both [place] and [place] locations is the same.  °[facility] [address], [place] (336) 299-0000   °[facility] [address] [place] [address], [place] or [address] (336) 852-7530 °(336) 878-2260   °[facility] [address],  (336) 350-1642, phone; (336) 294-5005, fax Sees patients 1st and 3rd Saturday of every month.  Must not qualify   for public or private insurance (i.e. Medicaid, Medicare, [place] Health Choice, Veterans' Benefits) • Household income should be no more than 200% of the poverty level •The clinic cannot treat you if you are pregnant or think you are pregnant • Sexually transmitted diseases are not treated at the clinic.  ° °Dental Care: °Organization         Address  Phone  Notes  °[facility] [address], [place] (336) 641-6152 Accepts children up to age 21 who are enrolled in Medicaid or [place] Health Choice; pregnant women with a Medicaid card; and children who have applied for Medicaid or [place] Health Choice, but were declined, whose parents can pay a reduced fee at time of service.  °[facility] [place]  [address], [place] (336) 641-7733 Accepts children up to age 21 who are enrolled in Medicaid or [place] Health Choice; pregnant women with a Medicaid card; and children who have applied for Medicaid or [place] Health Choice, but were declined, whose parents  can pay a reduced fee at time of service.  °[facility] ° [address], [place] (336) 641-4533 Patients are seen by appointment only. Walk-ins are not accepted. [facility] will see patients 18 years of age and older. °Monday - Tuesday (8am-5pm) °Most Wednesdays (8:30-5pm) °$30 per visit, cash only  °[facility] ° [address], [place] (336) 641-4533 Patients are seen by appointment only. Walk-ins are not accepted. [facility] will see patients 18 years of age and older. °One Wednesday Evening (Monthly: Volunteer Based).  $30 per visit, cash only  °[facility]  (919) 537-3737 for adults; Children under age 4, call Graduate Pediatric Dentistry at (919) [phone]. Children aged 4-14, please call (919) [phone] to request a pediatric application. ° Dental services are provided in all areas of dental care including fillings, crowns and bridges, complete and partial dentures, implants, gum treatment, root canals, and extractions. Preventive care is also provided. Treatment is provided to both adults and children. °Patients are selected via a lottery and there is often a waiting list. °  °[facility] [address], °[place] ° (336) 763-8833 [email or web address] °  °[facility] [address], [place], [place] (336)723-1848, Ext. 123 Second and Fourth Thursday of each month, opens at 6:30 AM; Clinic ends at 9 AM.  Patients are seen on a first-come first-served basis, and a limited number are seen during each clinic.  ° °[facility] ° [address], [place], [place] (336) 723-7904   Eligibility Requirements °You must have lived in [place], [place], or [place] counties for at least the last three months. °  You cannot be eligible for state or federal sponsored healthcare insurance, including Veterans Administration, Medicaid, or Medicare. °  You generally cannot be eligible for healthcare  insurance through your employer.  °  How to apply: °Eligibility screenings are held every Tuesday and Wednesday afternoon from 1:00 pm until 4:00 pm. You do not need an appointment for the interview!  °[facility] [address] [place] [address], [place], [place] 336-631-2330   °[facility]  336-342-8273   °[facility]  336-703-3100   °[facility]  336-570-6415   ° °Behavioral Health Resources in the Community: °Intensive Outpatient Programs °Organization         Address  Phone    Notes  °[facility] [address], [place], [place] 336-878-6098   °[facility] [address], [place], [place] 336-832-9800   °[facility] [address], [place], [place] ° 336-882-2125   °[facility] [address],  °[place], [place] 1-800-853-5163 or 336-641-4981   °Substance Abuse Resources °Organization         Address  Phone  Notes  °[facility]  336-882-2125   °[facility]  336-784-9470   °[facility]  336-285-9073   °[facility]  336-845-3988   °Residential & Outpatient Substance Abuse Program  1-800-659-3381   °Psychological Services °Organization         Address  Phone  Notes  °[facility]  336- 832-9600   °[facility]  336- 378-7881   °[facility] [address], [place] 1-800-853-5163 or 336-641-4981   ° °Mobile Crisis Teams °Organization         Address  Phone  Notes  °[facility], Mobile Crisis Care Unit  1-877-626-1772   °Assertive °[facility] ° [address] [place], [place] 336-834-9664   °Sharon DeEsch [address] °[place] 336-554-5454   ° °Self-Help/Support Groups °Organization         Address  Phone             Notes  °[facility] [place] [facility] - variety of support groups  336- 373-1402 Call for more information  °Narcotics Anonymous (NA),  [facility] [address], °[place]  2 meetings at this location  ° °Residential Treatment Programs °Organization         Address  Phone  Notes  °[facility] [address],    °[place]  1-866-801-8205   °[facility] ° [address], [place], [place] 704-293-8524   °[facility] [address], [place] 336-845-3988 Admissions: 8am-3pm M-F  °[facility] [address]    °[place], [place] 336-841-1104   °[facility] [address], [place], [place] 336-379-7146   °[facility] [address]  °[place], [place] 336-285-9073   °[facility] - Intensive Outpatient [address], [place], [place] 336-852-3033   °[facility] [address]  °[place], [place] 1-877-615-2722 or 336-784-9470   °[facility] [address] [place], [place] 336-227-7417 Accepts Medicaid  °[facility] [address]  ° [place] 1-800-659-3381 Substance Abuse/Addiction Treatment  ° °[place] Behavioral Health Resources °Organization         Address  Phone  Notes  °[facility]  (888) 581-9988   °Julie Brannon, PhD [address] [place], [place]   (336) 349-5553 or (336) 951-0000   °[facility]   [address] °[place], [place] (336) 349-4454   °[facility] [address], [place], [place] (336) 342-8316 Insurance/Medicaid/sponsorship through [facility]  °[facility] [address]                                    [place], [place] (336) 342-8316 Therapy/tele-psych/case  °[facility] [address]  ° ,  (336) 349-2233    °Dr. Arfeen  (336) 349-4544   °[facility] [place]  [employer] [place]   [facility] 1) [address], [place] °2) [address], [place] °3)  [address], [place] (336) 349-3220 °(336) 342-7768 ° °(336) 342-8140     °[place] [facility] (336) 342-1394 or (336) 342-3537 (After Hours)    ° °   °

## 2013-11-15 NOTE — ED Provider Notes (Signed)
CSN: [id number]     Arrival date & time 11/15/13  0923 History   First MD Initiated Contact with Patient 11/15/13 0701     Chief Complaint  Patient presents with  . Dental Pain     (Consider location/radiation/quality/duration/timing/severity/associated sxs/prior Treatment) Patient is a 28 y.o. [name] presenting with tooth pain.  Dental Pain Location:  Upper Upper teeth location:  16/LU 3rd molar and 15/LU 2nd molar Quality:  Dull, aching and throbbing Severity:  Severe Onset quality:  Gradual Duration: several months, much worse over past 2 days. Timing:  Constant Progression:  Worsening Context: poor dentition   Relieved by: Goody's  Worsened by:  Touching and pressure Associated symptoms: facial pain   Associated symptoms: no drooling, no fever and no trismus     History reviewed. No pertinent past medical history. Past Surgical History  Procedure Laterality Date  . Hand surgery     Family History  Problem Relation Age of Onset  . Coronary artery disease Other   . Cancer Other    History  Substance Use Topics  . Smoking status: Current Every Day Smoker -- 1.00 packs/day    Types: Cigarettes  . Smokeless tobacco: Not on file  . Alcohol Use: No    Review of Systems  Constitutional: Negative for fever.  HENT: Negative for drooling.   All other systems reviewed and are negative.     Allergies  Peanut-containing drug products  Home Medications   Prior to Admission medications   Medication Sig Start Date End Date Taking? Authorizing Provider  albuterol (PROVENTIL HFA;VENTOLIN HFA) 108 (90 BASE) MCG/ACT inhaler Inhale 1-2 puffs into the lungs every 6 (six) hours as needed for wheezing. 02/23/12 9/4/Chad  Sunnie Nielsen, MD  fluticasone (FLONASE) 50 MCG/ACT nasal spray Place 2 sprays into the nose daily.    Historical Provider, MD  guaiFENesin (MUCINEX) 600 MG 12 hr tablet Take 1,200 mg by mouth 2 (two) times daily.    Historical Provider, MD   BP 139/76  Pulse 66   Temp(Src) 97.9 F (36.6 C) (Oral)  Resp 16  SpO2 98% Physical Exam  Nursing note and vitals reviewed. Constitutional: He is oriented to person, place, and time. He appears well-developed and well-nourished. No distress.  HENT:  Head: Normocephalic and atraumatic.  Mouth/Throat:    Eyes: Conjunctivae are normal. No scleral icterus.  Neck: Neck supple.  Cardiovascular: Normal rate and intact distal pulses.   Pulmonary/Chest: Effort normal. No stridor. No respiratory distress.  Abdominal: Normal appearance. He exhibits no distension. There is no tenderness. There is no guarding.  Neurological: He is alert and oriented to person, place, and time.  Skin: Skin is warm and dry. No rash noted.  Psychiatric: He has a normal mood and affect. His behavior is normal.    ED Course  Procedures (including critical care time) Labs Review Labs Reviewed - No data to display  Imaging Review No results found.   EKG Interpretation None      MDM   Final diagnoses:  Pain, dental    Dental pain.  Treat with PenVK, tramadol, dentistry follow up.  No signs of deep space infection at this time.     Candyce Churn III, MD 11/15/13 [phone]

## 2014-06-25 ENCOUNTER — Encounter (HOSPITAL_COMMUNITY): Payer: Self-pay | Admitting: Emergency Medicine

## 2014-06-25 ENCOUNTER — Emergency Department (HOSPITAL_COMMUNITY)
Admission: EM | Admit: 2014-06-25 | Discharge: 2014-06-25 | Disposition: A | Discharge status: Home / Self Care | Payer: Self-pay | Attending: Emergency Medicine | Admitting: Emergency Medicine

## 2014-06-25 DIAGNOSIS — Y998 Other external cause status: Secondary | ICD-10-CM | POA: Insufficient documentation

## 2014-06-25 DIAGNOSIS — Y9389 Activity, other specified: Secondary | ICD-10-CM | POA: Insufficient documentation

## 2014-06-25 DIAGNOSIS — Z79899 Other long term (current) drug therapy: Secondary | ICD-10-CM | POA: Insufficient documentation

## 2014-06-25 DIAGNOSIS — S300XXA Contusion of lower back and pelvis, initial encounter: Secondary | ICD-10-CM | POA: Insufficient documentation

## 2014-06-25 DIAGNOSIS — Y9289 Other specified places as the place of occurrence of the external cause: Secondary | ICD-10-CM | POA: Insufficient documentation

## 2014-06-25 DIAGNOSIS — W1789XA Other fall from one level to another, initial encounter: Secondary | ICD-10-CM | POA: Insufficient documentation

## 2014-06-25 DIAGNOSIS — Z720 Tobacco use: Secondary | ICD-10-CM | POA: Insufficient documentation

## 2014-06-25 DIAGNOSIS — Z7951 Long term (current) use of inhaled steroids: Secondary | ICD-10-CM | POA: Insufficient documentation

## 2014-06-25 MED ORDER — ACETAMINOPHEN 500 MG PO TABS
500.0000 mg | ORAL_TABLET | Freq: Once | ORAL | Status: AC
  Administered 2014-06-25: 500 mg via ORAL
  Filled 2014-06-25: qty 1

## 2014-06-25 MED ORDER — NAPROXEN 500 MG PO TABS: 500.0000 mg | ORAL_TABLET | Freq: Two times a day (BID) | ORAL | Status: DC
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
## 2014-06-25 NOTE — Discharge Instructions (Signed)
Tailbone Injury  The tailbone (coccyx) is the small bone at the lower end of the spine. A tailbone injury may involve stretched ligaments, bruising, or a broken bone (fracture). Women are more vulnerable to this injury due to having a wider pelvis.  CAUSES   This type of injury typically occurs from falling and landing on the tailbone. Repeated strain or friction from actions such as rowing and bicycling may also injure the area. The tailbone can be injured during childbirth. Infections or tumors may also press on the tailbone and cause pain. Sometimes, the cause of injury is unknown.  SYMPTOMS    Bruising.   Pain when sitting.   Painful bowel movements.   In women, pain during intercourse.  DIAGNOSIS   Your caregiver can diagnose a tailbone injury based on your symptoms and a physical exam. X-rays may be taken if a fracture is suspected. Your caregiver may also use an MRI scan imaging test to evaluate your symptoms.  TREATMENT   Your caregiver may prescribe medicines to help relieve your pain. Most tailbone injuries heal on their own in 4 to 6 weeks. However, if the injury is caused by an infection or tumor, the recovery period may vary.  PREVENTION   Wear appropriate padding and sports gear when bicycling and rowing. This can help prevent an injury from repeated strain or friction.  HOME CARE INSTRUCTIONS    Put ice on the injured area.   Put ice in a plastic bag.   Place a towel between your skin and the bag.   Leave the ice on for 15-20 minutes, every hour while awake for the first 1 to 2 days.   Sit on a large, rubber or inflated ring or cushion to ease your pain. Lean forward when sitting to help decrease discomfort.   Avoid sitting for long periods of time.   Increase your activity as the pain allows.   Only take over-the-counter or prescription medicines for pain, discomfort, or fever as directed by your caregiver.   You may use stool softeners if it is painful to have a bowel movement, or as  directed by your caregiver.   Eat a diet with plenty of fiber to help prevent constipation.   Keep all follow-up appointments as directed by your caregiver.  SEEK MEDICAL CARE IF:    Your pain becomes worse.   Your bowel movements cause a great deal of discomfort.   You are unable to have a bowel movement.   You have a fever.  MAKE SURE YOU:   Understand these instructions.   Will watch your condition.   Will get help right away if you are not doing well or get worse.  Document Released: 06/04/2000 Document Revised: 08/30/2011 Document Reviewed: 12/31/2010  ExitCare Patient Information 2015 [employer]. This information is not intended to replace advice given to you by your health care provider. Make sure you discuss any questions you have with your health care provider.

## 2014-06-25 NOTE — ED Notes (Signed)
Patient ambulatory from ED WR Patient able to ambulate without difficulty or assistance from staff Patient in NAD

## 2015-06-03 ENCOUNTER — Ambulatory Visit (INDEPENDENT_AMBULATORY_CARE_PROVIDER_SITE_OTHER): Payer: 59 | Admitting: Family Medicine

## 2015-06-03 ENCOUNTER — Encounter: Payer: Self-pay | Admitting: Family Medicine

## 2015-06-03 VITALS — BP 140/84 | HR 98 | Temp 98.2°F | Resp 18

## 2015-06-03 DIAGNOSIS — R0602 Shortness of breath: Secondary | ICD-10-CM

## 2015-06-03 DIAGNOSIS — T7800XA Anaphylactic reaction due to unspecified food, initial encounter: Secondary | ICD-10-CM

## 2015-06-03 DIAGNOSIS — R1310 Dysphagia, unspecified: Secondary | ICD-10-CM

## 2015-06-03 DIAGNOSIS — R221 Localized swelling, mass and lump, neck: Secondary | ICD-10-CM

## 2015-06-03 MED ORDER — RANITIDINE HCL 150 MG PO TABS
300.0000 mg | ORAL_TABLET | Freq: Once | ORAL | Status: AC
  Administered 2015-06-03: 300 mg via ORAL

## 2015-06-03 MED ORDER — DIPHENHYDRAMINE HCL 50 MG/ML IJ SOLN
25.0000 mg | Freq: Once | INTRAMUSCULAR | Status: AC
  Administered 2015-06-03: 25 mg via INTRAMUSCULAR

## 2015-06-03 MED ORDER — METHYLPREDNISOLONE SODIUM SUCC 125 MG IJ SOLR
125.0000 mg | Freq: Once | INTRAMUSCULAR | Status: AC
  Administered 2015-06-03: 125 mg via INTRAVENOUS

## 2015-06-03 MED ORDER — EPINEPHRINE 0.3 MG/0.3ML IJ SOAJ
0.3000 mg | Freq: Once | INTRAMUSCULAR | Status: AC
  Administered 2015-06-03: 0.3 mg via INTRAMUSCULAR

## 2015-06-03 MED ORDER — EPINEPHRINE 0.3 MG/0.3ML IJ SOAJ: 0.3000 mg | Freq: Once | INTRAMUSCULAR | Status: DC

## 2015-06-03 NOTE — Progress Notes (Signed)
Chief Complaint:  Chief Complaint  Patient presents with  . Allergic Reaction    ate a choclate covered pretzel, that may have had nuts on them    HPI: Bertram MillardMarshall L Dapper is a 29 y.o. male who [facility] complaining of having an allergic reaction , was at work and ate chocolate covered pretzels which may have had nuts and had an allergic reaction He has a feeling of chest discomfort,  difficulty breathing, swelling of throat sensation. His face is swollen.HE had an allergic reaction when he was young. His children have food allergies. He denies any voice changes.  No other allergies. He took 50 mg of PO benadryl. He is a smoker.   No past medical history on file. Past Surgical History  Procedure Laterality Date  . Hand surgery     Social History   Social History  . Marital Status: Married    Spouse Name: N/A  . Number of Children: N/A  . Years of Education: N/A   Social History Main Topics  . Smoking status: Current Every Day Smoker -- 1.50 packs/day    Types: Cigarettes  . Smokeless tobacco: None  . Alcohol Use: No  . Drug Use: No  . Sexual Activity: Not Asked   Other Topics Concern  . None   Social History Narrative   Family History  Problem Relation Age of Onset  . Coronary artery disease Other   . Cancer Other    Allergies  Allergen Reactions  . Peanut-Containing Drug Products Shortness Of Breath and Nausea And Vomiting   Prior to Admission medications   Medication Sig Start Date End Date Taking? Authorizing Provider  albuterol (PROVENTIL HFA;VENTOLIN HFA) 108 (90 BASE) MCG/ACT inhaler Inhale 1-2 puffs into the lungs every 6 (six) hours as needed for wheezing. 02/23/12 02/22/13  Sunnie NielsenBrian Opitz, MD  fluticasone (FLONASE) 50 MCG/ACT nasal spray Place 2 sprays into the nose daily.    Historical Provider, MD  guaiFENesin (MUCINEX) 600 MG 12 hr tablet Take 1,200 mg by mouth 2 (two) times daily.    Historical Provider, MD  naproxen (NAPROSYN) 500 MG  tablet Take 1 tablet (500 mg total) by mouth 2 (two) times daily with a meal. 06/25/14   Everlene FarrierWilliam Dansie, PA-C  traMADol (ULTRAM) 50 MG tablet Take 1 tablet (50 mg total) by mouth every 6 (six) hours as needed. 11/15/13   Blake DivineJohn Wofford, MD     ROS: The patient denies fevers, chills, night sweats, unintentional weight loss, nausea, vomiting, abdominal pain, dysuria, hematuria, melena, numbness, weakness, or tingling.   All other systems have been reviewed and were otherwise negative with the exception of those mentioned in the HPI and as above.    PHYSICAL EXAM: Filed Vitals:   06/03/15 1632 06/03/15 1703  BP: 128/72 140/84  Pulse: 81 98  Temp:  98.2 F (36.8 C)  Resp:  18   There is no height or weight on file to calculate BMI.   General: Alert,  he is talking without airway obstruction, slightly flushed and trying to be calm but is anxxious HEENT:  Normocephalic, atraumatic, oropharynx patent. EOMI, PERRLA Cardiovascular:  Regular rate and rhythm, no rubs murmurs or gallops.  No Carotid bruits, radial pulse intact. No pedal edema.  Respiratory: Clear to auscultation bilaterally.  No wheezes, rales, or rhonchi.  No cyanosis, no use of accessory musculature Abdominal: No organomegaly, abdomen is soft and non-tender, positive bowel sounds. No masses. Skin:Swealling of eyelids, went down  and had less swelling with Epi and benadryl Neurologic: Facial musculature symmetric. Psychiatric: Patient acts appropriately throughout our interaction. Lymphatic: No cervical or submandibular lymphadenopathy Musculoskeletal: Gait intact. No edema, tenderness   LABS:    EKG/XRAY:   Primary read interpreted by Dr. Conley Rolls at [facility].   ASSESSMENT/PLAN: Encounter Diagnoses  Name Primary?  Marland Kitchen Anaphylaxis due to food, initial encounter Yes  . SOB (shortness of breath)   . Dysphagia   . Throat swelling    Rx epi pen Benadryl 25 mg q 8 hrs Was better after given Solumedrol 125 mg , Zantac 150 mg x2 ,  Benadryl 25 mg IM x 1, Epi 0.3 mg  IM  Fu prn , precautions given  Refer to allergy testing  Gross sideeffects, risk and benefits, and alternatives of medications d/w patient. Patient is aware that all medications have potential sideeffects and we are unable to predict every sideeffect or drug-drug interaction that may occur.  Krystena Reitter DO  06/03/2015 6:56 PM

## 2015-06-03 NOTE — Patient Instructions (Signed)
Anaphylactic Reaction An anaphylactic reaction is a sudden, severe allergic reaction that involves the whole body. It can be life threatening. A hospital stay is often required. People with asthma, eczema, or hay fever are slightly more likely to have an anaphylactic reaction. CAUSES  An anaphylactic reaction may be caused by anything to which you are allergic. After being exposed to the allergic substance, your immune system becomes sensitized to it. When you are exposed to that allergic substance again, an allergic reaction can occur. Common causes of an anaphylactic reaction include:  Medicines.  Foods, especially peanuts, wheat, shellfish, milk, and eggs.  Insect bites or stings.  Blood products.  Chemicals, such as dyes, latex, and contrast material used for imaging tests. SYMPTOMS  When an allergic reaction occurs, the body releases histamine and other substances. These substances cause symptoms such as tightening of the airway. Symptoms often develop within seconds or minutes of exposure. Symptoms may include:  Skin rash or hives.  Itching.  Chest tightness.  Swelling of the eyes, tongue, or lips.  Trouble breathing or swallowing.  Lightheadedness or fainting.  Anxiety or confusion.  Stomach pains, vomiting, or diarrhea.  Nasal congestion.  A fast or irregular heartbeat (palpitations). DIAGNOSIS  Diagnosis is based on your history of recent exposure to allergic substances, your symptoms, and a physical exam. Your caregiver may also perform blood or urine tests to confirm the diagnosis. TREATMENT  Epinephrine medicine is the main treatment for an anaphylactic reaction. Other medicines that may be used for treatment include antihistamines, steroids, and albuterol. In severe cases, fluids and medicine to support blood pressure may be given through an intravenous line (IV). Even if you improve after treatment, you need to be observed to make sure your condition does not get  worse. This may require a stay in the hospital. HOME CARE INSTRUCTIONS   Wear a medical alert bracelet or necklace stating your allergy.  You and your family must learn how to use an anaphylaxis kit or give an epinephrine injection to temporarily treat an emergency allergic reaction. Always carry your epinephrine injection or anaphylaxis kit with you. This can be lifesaving if you have a severe reaction.  Do not drive or perform tasks after treatment until the medicines used to treat your reaction have worn off, or until your caregiver says it is okay.  If you have hives or a rash:  Take medicines as directed by your caregiver.  You may use an over-the-counter antihistamine (diphenhydramine) as needed.  Apply cold compresses to the skin or take baths in cool water. Avoid hot baths or showers. SEEK MEDICAL CARE IF:   You develop symptoms of an allergic reaction to a new substance. Symptoms may start right away or minutes later.  You develop a rash, hives, or itching.  You develop new symptoms. SEEK IMMEDIATE MEDICAL CARE IF:   You have swelling of the mouth, difficulty breathing, or wheezing.  You have a tight feeling in your chest or throat.  You develop hives, swelling, or itching all over your body.  You develop severe vomiting or diarrhea.  You feel faint or pass out. This is an emergency. Use your epinephrine injection or anaphylaxis kit as you have been instructed. Call your local emergency services (911 in [place] Even if you improve after the injection, you need to be examined at a hospital emergency department. MAKE SURE YOU:   Understand these instructions.  Will watch your condition.  Will get help right away if you are not   doing well or get worse.   This information is not intended to replace advice given to you by your health care provider. Make sure you discuss any questions you have with your health care provider.   Document Released: 06/07/2005 Document  Revised: 06/12/2013 Document Reviewed: 12/18/2014 Elsevier Interactive Patient Education [employer].

## 2017-01-12 ENCOUNTER — Emergency Department (HOSPITAL_COMMUNITY): Payer: Self-pay

## 2017-01-12 ENCOUNTER — Emergency Department (HOSPITAL_COMMUNITY)
Admission: EM | Admit: 2017-01-12 | Discharge: 2017-01-12 | Disposition: A | Discharge status: Home / Self Care | Payer: Self-pay | Attending: Emergency Medicine | Admitting: Emergency Medicine

## 2017-01-12 ENCOUNTER — Encounter (HOSPITAL_COMMUNITY): Payer: Self-pay | Admitting: Emergency Medicine

## 2017-01-12 DIAGNOSIS — Y999 Unspecified external cause status: Secondary | ICD-10-CM | POA: Insufficient documentation

## 2017-01-12 DIAGNOSIS — T148XXA Other injury of unspecified body region, initial encounter: Secondary | ICD-10-CM

## 2017-01-12 DIAGNOSIS — Y939 Activity, unspecified: Secondary | ICD-10-CM | POA: Insufficient documentation

## 2017-01-12 DIAGNOSIS — F17210 Nicotine dependence, cigarettes, uncomplicated: Secondary | ICD-10-CM | POA: Insufficient documentation

## 2017-01-12 DIAGNOSIS — S39012A Strain of muscle, fascia and tendon of lower back, initial encounter: Secondary | ICD-10-CM | POA: Insufficient documentation

## 2017-01-12 DIAGNOSIS — M5441 Lumbago with sciatica, right side: Secondary | ICD-10-CM | POA: Insufficient documentation

## 2017-01-12 DIAGNOSIS — Y929 Unspecified place or not applicable: Secondary | ICD-10-CM | POA: Insufficient documentation

## 2017-01-12 DIAGNOSIS — Z79899 Other long term (current) drug therapy: Secondary | ICD-10-CM | POA: Insufficient documentation

## 2017-01-12 MED ORDER — IBUPROFEN 600 MG PO TABS: 600.0000 mg | ORAL_TABLET | Freq: Four times a day (QID) | ORAL | 0 refills | Status: DC | PRN

## 2017-01-12 MED ORDER — IBUPROFEN 800 MG PO TABS
800.0000 mg | ORAL_TABLET | Freq: Once | ORAL | Status: AC
  Administered 2017-01-12: 800 mg via ORAL
  Filled 2017-01-12: qty 1

## 2017-01-12 MED ORDER — METHOCARBAMOL 500 MG PO TABS: 500.0000 mg | ORAL_TABLET | Freq: Two times a day (BID) | ORAL | 0 refills | Status: DC

## 2017-01-12 NOTE — Discharge Instructions (Signed)
You can take Tylenol or Ibuprofen as directed for pain.  Take Robaxin as directed for muscle tension.   Follow-up with the referred clinic in 24-48 hours for further evaluation.   Return the emergency Department for any worsening pain, difficulty walking, numbness/weakness of her arms or legs, urinary or bowel accidents, fevers, chest pain, difficult to breathing or any other worsening or concerning symptoms.
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
## 2017-06-05 ENCOUNTER — Other Ambulatory Visit: Payer: Self-pay

## 2017-06-05 ENCOUNTER — Emergency Department (HOSPITAL_COMMUNITY)
Admission: EM | Admit: 2017-06-05 | Discharge: 2017-06-05 | Disposition: A | Discharge status: Home / Self Care | Payer: Self-pay | Attending: Emergency Medicine | Admitting: Emergency Medicine

## 2017-06-05 DIAGNOSIS — F17210 Nicotine dependence, cigarettes, uncomplicated: Secondary | ICD-10-CM | POA: Insufficient documentation

## 2017-06-05 DIAGNOSIS — Z79899 Other long term (current) drug therapy: Secondary | ICD-10-CM | POA: Insufficient documentation

## 2017-06-05 DIAGNOSIS — K0889 Other specified disorders of teeth and supporting structures: Secondary | ICD-10-CM | POA: Insufficient documentation

## 2017-06-05 MED ORDER — PENICILLIN V POTASSIUM 500 MG PO TABS: 500.0000 mg | ORAL_TABLET | Freq: Three times a day (TID) | ORAL | 0 refills | Status: AC

## 2017-06-05 NOTE — ED Triage Notes (Signed)
Pt states right upper tooth pain radiating up into gums/jaw. Has appointment for tooth removal until January 7th. Pt states BC powder not relieving pain. Needs something for pain to help get to appointment.

## 2017-06-05 NOTE — Discharge Instructions (Signed)
Please see the information and instructions below regarding your visit.  Your diagnoses today include:  1. Pain, dental    You have a dental infection. It is very important that you get evaluated by a dentist as soon as possible. Call tomorrow to schedule an appointment. Ibuprofen as needed for pain.  You may combine this with Tylenol.  Do not exceed 4 g of Tylenol in 1 day.  Take your full course of antibiotics. Read the instructions below.  Tests performed today include: See side panel of your discharge paperwork for testing performed today. Vital signs are listed at the bottom of these instructions.   Medications prescribed:    Take any prescribed medications only as prescribed, and any over the counter medications only as directed on the packaging.  1. You are prescribed penicillin, an antibiotic. Please take all of your antibiotics until finished.   You may develop abdominal discomfort or nausea from the antibiotic. If this occurs, you may take it with food. Some patients also get diarrhea with antibiotics. You may help offset this with probiotics which you can buy or get in yogurt. Do not eat or take the probiotics until 2 hours after your antibiotic.   Some people develop allergies to antibiotics. Symptoms of antibiotic allergy can be mild and include a flat rash and itching. They can also be more serious and include:  ?Hives - Hives are raised, red patches of skin that are usually very itchy.  ?Lip or tongue swelling  ?Trouble swallowing or breathing  ?Blistering of the skin or mouth.  If you have any of these serious symptoms, please seek emergency medical care immediately.  2. You are prescribed ibuprofen, a non-steroidal anti-inflammatory agent (NSAID) for pain. You may take 400mg  every 6 hours as needed for pain. If still requiring this medication around the clock for acute pain after 10 days, please see your primary healthcare provider.  You may combine this medication with  Tylenol, 650 mg every 6 hours, so you are receiving something for pain every 3 hours.  This is not a long-term medication unless under the care and direction of your primary provider. Taking this medication long-term and not under the supervision of a healthcare provider could increase the risk of stomach ulcers, kidney problems, and cardiovascular problems such as high blood pressure.   Home care instructions:  Please follow any educational materials contained in this packet.   Eat a soft or liquid diet and rinse your mouth out after meals with warm water. You should see a dentist or return here at once if you have increased swelling, increased pain or uncontrolled bleeding from the site of your injury.  Follow-up instructions: It is very important that you see a dentist as soon as possible. There is a list of dentists attached to this packet if you do not have care established with a dentist already. Please give a call to a dentist of your choice tomorrow.  Return instructions:  Please return to the Emergency Department if you experience worsening symptoms.  Please seek care if you note any of the following about your dental pain:  You have increased pain not controlled with medicines.  You have swelling around your tooth, in your face or neck.  You have bleeding which starts, continues, or gets worse.  You have a fever >101 If you are unable to open your mouth Please return if you have any other emergent concerns.  Additional Information:   Your vital signs today were: BP  132/83    Pulse 72    Temp 98 F (36.7 C)    Resp 18    SpO2 100%  If your blood pressure (BP) was elevated on multiple readings during this visit above 130 for the top number or above 80 for the bottom number, please have this repeated by your primary care provider within one month. --------------  Thank you for allowing us to participate in your care today.

## 2017-06-05 NOTE — ED Provider Notes (Signed)
[facility] Note   CSN: [id number] Arrival date & time: 06/05/17  1201     History   Chief Complaint Chief Complaint  Patient presents with  . Dental Pain    HPI Chad [name] is a 31 y.o. male.  HPI   Chad [name] is a 31 y.o. male who presents to the Emergency Department complaining of persistent, gradually worsening, right-sided, upper dental pain beginning 1 week ago. Pt describes their pain as  throbbing. Pt has been taking BC powder at home with minimal relief of pain. Pain is exacerbated by chewing.  Patient reports he is only been able to tolerate noodles due to pain, however he is swallowing without difficulty.  No stridor or difficulty breathing they are currenlty followed by dentistry, but cannot get into see a dentist until January.  Pt denies  facial swelling, fever, chills, difficulty breathing, difficulty swallowing.   No past medical history on file.  There are no active problems to display for this patient.   Past Surgical History:  Procedure Laterality Date  . HAND SURGERY         Home Medications    Prior to Admission medications   Medication Sig Start Date End Date Taking? Authorizing Provider  albuterol (PROVENTIL HFA;VENTOLIN HFA) 108 (90 BASE) MCG/ACT inhaler Inhale 1-2 puffs into the lungs every 6 (six) hours as needed for wheezing. 02/23/12 02/22/13  Sunnie Nielsenpitz, Brian, MD  EPINEPHrine 0.3 mg/0.3 mL IJ SOAJ injection Inject 0.3 mLs (0.3 mg total) into the muscle once. 06/03/15   Le, Thao P, DO  fluticasone (FLONASE) 50 MCG/ACT nasal spray Place 2 sprays into the nose daily.    [provider]  guaiFENesin (MUCINEX) 600 MG 12 hr tablet Take 1,200 mg by mouth 2 (two) times daily.    [provider]  ibuprofen (ADVIL,MOTRIN) 600 MG tablet Take 1 tablet (600 mg total) by mouth every 6 (six) hours as needed. 01/12/17   Maxwell CaulLayden, Lindsey A, PA-C  methocarbamol (ROBAXIN) 500 MG tablet Take  1 tablet (500 mg total) by mouth 2 (two) times daily. 01/12/17   Maxwell CaulLayden, Lindsey A, PA-C    Family History Family History  Problem Relation Age of Onset  . Coronary artery disease Other   . Cancer Other     Social History Social History   Tobacco Use  . Smoking status: Current Every Day Smoker    Packs/day: 1.50    Types: Cigarettes  Substance Use Topics  . Alcohol use: No  . Drug use: No     Allergies   Peanut-containing drug products   Review of Systems Review of Systems  Constitutional: Negative for chills and fever.  HENT: Positive for dental problem. Negative for sore throat, trouble swallowing and voice change.   Gastrointestinal: Negative for nausea and vomiting.     Physical Exam Updated Vital Signs BP 132/83   Pulse 72   Temp 98 F (36.7 C)   Resp 18   SpO2 100%   Physical Exam  Constitutional: He appears well-developed and well-nourished. No distress.  Sitting comfortably in bed.  HENT:  Head: Normocephalic and atraumatic.  Mouth/Throat:    Dental cavities and poor oral dentition noted. Pain along tooth as depicted in image. No abscess noted. Midline uvula. No trismus. OP clear and moist. No oropharyngeal erythema or edema. Neck supple with no tenderness. No facial edema.  Eyes: Conjunctivae are normal. Right eye exhibits no discharge. Left eye exhibits no discharge.  EOMs normal to gross examination.  Neck: Normal range of motion.  Cardiovascular: Normal rate and regular rhythm.  Intact, 2+ radial pulse.  Pulmonary/Chest:  Normal respiratory effort. Patient converses comfortably. No audible wheeze or stridor.  Abdominal: He exhibits no distension.  Musculoskeletal: Normal range of motion.  Neurological: He is alert.  Cranial nerves intact to gross observation. Patient moves extremities without difficulty.  Skin: Skin is warm and dry. He is not diaphoretic.  Psychiatric: He has a normal mood and affect. His behavior is normal. Judgment and  thought content normal.  Nursing note and vitals reviewed.    ED Treatments / Results  Labs (all labs ordered are listed, but only abnormal results are displayed) Labs Reviewed - No data to display  EKG  EKG Interpretation None       Radiology No results found.  Procedures Procedures (including critical care time)  Medications Ordered in ED Medications - No data to display   Initial Impression / Assessment and Plan / ED Course  I have reviewed the triage vital signs and the nursing notes.  Pertinent labs & imaging results that were available during my care of the patient were reviewed by me and considered in my medical decision making (see chart for details).     Final Clinical Impressions(s) / ED Diagnoses   Final diagnoses:  Pain, dental   Chad [name] is a 31 y.o. male who presents to ED for dental pain. No abscess requiring immediate incision and drainage. Patient is afebrile, non toxic appearing, and swallowing secretions well. Exam not concerning for Ludwig's angina or pharyngeal abscess. Will treat with Penicillin VK. I provided dental resource guide and stressed the importance of dental follow up for ultimate management of dental pain. Patient voices understanding and is agreeable to plan.  Return precautions for any lingual swelling, pharyngeal swelling, or difficulty swallowing.   ED Discharge Orders    None       Delia ChimesMurray, Lux Meaders B, PA-C 06/05/17 1400    Margarita Grizzleay, Danielle, MD 06/05/17 [phone]

## 2018-01-10 ENCOUNTER — Encounter (HOSPITAL_COMMUNITY): Payer: Self-pay | Admitting: Emergency Medicine

## 2018-01-10 ENCOUNTER — Emergency Department (HOSPITAL_COMMUNITY)
Admission: EM | Admit: 2018-01-10 | Discharge: 2018-01-10 | Disposition: A | Discharge status: Home / Self Care | Payer: BLUE CROSS/BLUE SHIELD | Attending: Emergency Medicine | Admitting: Emergency Medicine

## 2018-01-10 ENCOUNTER — Emergency Department (HOSPITAL_COMMUNITY): Payer: BLUE CROSS/BLUE SHIELD

## 2018-01-10 ENCOUNTER — Other Ambulatory Visit: Payer: Self-pay

## 2018-01-10 DIAGNOSIS — F17210 Nicotine dependence, cigarettes, uncomplicated: Secondary | ICD-10-CM | POA: Insufficient documentation

## 2018-01-10 DIAGNOSIS — Y999 Unspecified external cause status: Secondary | ICD-10-CM | POA: Diagnosis not present

## 2018-01-10 DIAGNOSIS — Y939 Activity, unspecified: Secondary | ICD-10-CM | POA: Diagnosis not present

## 2018-01-10 DIAGNOSIS — W230XXA Caught, crushed, jammed, or pinched between moving objects, initial encounter: Secondary | ICD-10-CM | POA: Diagnosis not present

## 2018-01-10 DIAGNOSIS — S61329A Laceration with foreign body of unspecified finger with damage to nail, initial encounter: Secondary | ICD-10-CM

## 2018-01-10 DIAGNOSIS — Z79899 Other long term (current) drug therapy: Secondary | ICD-10-CM | POA: Insufficient documentation

## 2018-01-10 DIAGNOSIS — Y929 Unspecified place or not applicable: Secondary | ICD-10-CM | POA: Diagnosis not present

## 2018-01-10 DIAGNOSIS — S61321A Laceration with foreign body of left index finger with damage to nail, initial encounter: Secondary | ICD-10-CM | POA: Diagnosis not present

## 2018-01-10 DIAGNOSIS — S6992XA Unspecified injury of left wrist, hand and finger(s), initial encounter: Secondary | ICD-10-CM | POA: Diagnosis present

## 2018-01-10 MED ORDER — LIDOCAINE HCL 2 % IJ SOLN
10.0000 mL | Freq: Once | INTRAMUSCULAR | Status: AC
  Administered 2018-01-10: 200 mg via INTRADERMAL
  Filled 2018-01-10: qty 20

## 2018-01-10 MED ORDER — TETANUS-DIPHTH-ACELL PERTUSSIS 5-2.5-18.5 LF-MCG/0.5 IM SUSP
0.5000 mL | Freq: Once | INTRAMUSCULAR | Status: DC
  Filled 2018-01-10: qty 0.5

## 2018-01-10 MED ORDER — LORAZEPAM 1 MG PO TABS
1.0000 mg | ORAL_TABLET | Freq: Once | ORAL | Status: AC
  Administered 2018-01-10: 1 mg via ORAL
  Filled 2018-01-10: qty 1

## 2018-01-10 MED ORDER — LORAZEPAM 2 MG/ML IJ SOLN
0.5000 mg | Freq: Once | INTRAMUSCULAR | Status: AC
  Administered 2018-01-10: 0.5 mg via INTRAMUSCULAR
  Filled 2018-01-10: qty 1

## 2018-01-10 MED ORDER — FENTANYL CITRATE (PF) 100 MCG/2ML IJ SOLN
50.0000 ug | Freq: Once | INTRAMUSCULAR | Status: AC
  Administered 2018-01-10: 50 ug via INTRAMUSCULAR
  Filled 2018-01-10: qty 2

## 2018-01-10 NOTE — Discharge Instructions (Signed)
Follow up with the hand doctor, Dr. Merlyn LotKuzma for re-evaluation in 1 week. Return for suture removal in 14 days.   Please follow up with primary care to establish care.  Return to the emergency room for any new or worsening symptoms including any signs of infection.

## 2018-01-10 NOTE — ED Provider Notes (Signed)
[facility]-EMERGENCY DEPT Provider Note   CSN: [id number] Arrival date & time: 01/10/18  1252     History   Chief Complaint Chief Complaint  Patient presents with  . Extremity Laceration    HPI Chad MillardMarshall L [name] is a 32 y.o. male.  HPI   Patient is a 32 year old male no significant past medical history presents emergency department today for evaluation of the left index finger injury that occurred prior to arrival.  Patient reports that he crushed his left index finger in a door prior to arrival.  Had immediate onset of pain to the left index finger.  Also notes a laceration and bleeding from the area.  Denies any other injuries.  Pain is constant and severe in nature.  Worse with movement and palpation.  Has not tried any interventions for his symptoms.  States that his Tdap is up-to-date.  History reviewed. No pertinent past medical history.  There are no active problems to display for this patient.   Past Surgical History:  Procedure Laterality Date  . HAND SURGERY    . WISDOM TOOTH EXTRACTION          Home Medications    Prior to Admission medications   Medication Sig Start Date End Date Taking? Authorizing Provider  HYDROcodone-acetaminophen (NORCO) 10-325 MG tablet TAKE 1 TABLET BY MOUTH EVERY 4 TO 6 HOURS AS NEEDED FOR PAIN 01/03/18  Yes [provider]  albuterol (PROVENTIL HFA;VENTOLIN HFA) 108 (90 BASE) MCG/ACT inhaler Inhale 1-2 puffs into the lungs every 6 (six) hours as needed for wheezing. 02/23/12 02/22/13  Sunnie Nielsenpitz, Brian, MD  EPINEPHrine 0.3 mg/0.3 mL IJ SOAJ injection Inject 0.3 mLs (0.3 mg total) into the muscle once. Patient not taking: Reported on 01/10/2018 06/03/15   Le, Thao P, DO  fluticasone (FLONASE) 50 MCG/ACT nasal spray Place 2 sprays into the nose daily.    [provider]  guaiFENesin (MUCINEX) 600 MG 12 hr tablet Take 1,200 mg by mouth 2 (two) times daily.    [provider]  ibuprofen  (ADVIL,MOTRIN) 600 MG tablet Take 1 tablet (600 mg total) by mouth every 6 (six) hours as needed. Patient not taking: Reported on 01/10/2018 01/12/17   Graciella FreerLayden, Lindsey A, PA-C  methocarbamol (ROBAXIN) 500 MG tablet Take 1 tablet (500 mg total) by mouth 2 (two) times daily. Patient not taking: Reported on 01/10/2018 01/12/17   Maxwell CaulLayden, Lindsey A, PA-C    Family History Family History  Problem Relation Age of Onset  . Coronary artery disease Other   . Cancer Other     Social History Social History   Tobacco Use  . Smoking status: Current Every Day Smoker    Packs/day: 1.50    Types: Cigarettes  Substance Use Topics  . Alcohol use: No  . Drug use: No     Allergies   Peanut-containing drug products   Review of Systems Review of Systems  Constitutional: Negative for fever.  Musculoskeletal:       Finger pain  Skin: Positive for wound.  Neurological: Negative for weakness and numbness.     Physical Exam Updated Vital Signs BP 126/68 (BP Location: Right Arm)   Pulse 75   Temp 99 F (37.2 C) (Oral)   Resp 18   Ht 5\' 11"  (1.803 m)   Wt 74.8 kg (165 lb)   SpO2 98%   BMI 23.01 kg/m   Physical Exam  Constitutional: He is oriented to person, place, and time. He appears well-developed and  well-nourished. He appears distressed.  Eyes: Conjunctivae are normal.  Cardiovascular: Normal rate and regular rhythm.  Pulmonary/Chest: Effort normal and breath sounds normal.  Musculoskeletal:  Flexion/extension intact to the left index finger. 1.5cm curved laceration across the dorsal DIP joint. Distal sensation intact. Brisk cap refill.   Neurological: He is alert and oriented to person, place, and time.  Skin: Skin is warm and dry. Capillary refill takes less than 2 seconds.  Psychiatric:  Anxious, tearful   ED Treatments / Results  Labs (all labs ordered are listed, but only abnormal results are displayed) Labs Reviewed - No data to display  EKG None  Radiology Dg Finger  Index Left  Result Date: 01/10/2018 CLINICAL DATA:  Mashed distal left second digit in door with pain EXAM: LEFT INDEX FINGER 2+V COMPARISON:  None. FINDINGS: No acute fracture is seen. Alignment is normal. Joint spaces appear normal. IMPRESSION: Negative. Electronically Signed   By: Dwyane Dee M.D.   On: 01/10/2018 13:44    Procedures .Marland KitchenLaceration Repair Date/Time: 01/10/2018 3:38 PM Performed by: Karrie Meres, PA-C Authorized by: Karrie Meres, PA-C   Consent:    Consent obtained:  Verbal   Consent given by:  Patient   Risks discussed:  Infection and pain   Alternatives discussed:  No treatment Anesthesia (see MAR for exact dosages):    Anesthesia method:  Nerve block   Block needle gauge:  25 G   Block anesthetic:  Lidocaine 2% w/o epi   Block injection procedure:  Anatomic landmarks identified, introduced needle, incremental injection, negative aspiration for blood and anatomic landmarks palpated   Block outcome:  Anesthesia achieved Laceration details:    Location:  Finger   Finger location:  L index finger   Length (cm):  1.5 Repair type:    Repair type:  Simple Pre-procedure details:    Preparation:  Patient was prepped and draped in usual sterile fashion and imaging obtained to evaluate for foreign bodies Exploration:    Wound exploration: wound explored through full range of motion and entire depth of wound probed and visualized   Treatment:    Area cleansed with:  Saline   Amount of cleaning:  Extensive   Irrigation solution:  Sterile saline   Irrigation volume:  1L   Irrigation method:  Pressure wash   Visualized foreign bodies/material removed: no   Skin repair:    Repair method:  Sutures   Suture size:  5-0   Suture material:  Prolene   Number of sutures:  6 Approximation:    Approximation:  Close Post-procedure details:    Dressing: xeroform and bulky dressing.   Patient tolerance of procedure:  Tolerated well, no immediate complications    (including critical care time)  Medications Ordered in ED Medications  fentaNYL (SUBLIMAZE) injection 50 mcg (50 mcg Intramuscular Given 01/10/18 1323)  lidocaine (XYLOCAINE) 2 % (with pres) injection 200 mg (200 mg Intradermal Given 01/10/18 1412)  LORazepam (ATIVAN) injection 0.5 mg (0.5 mg Intramuscular Given 01/10/18 1410)  LORazepam (ATIVAN) tablet 1 mg (1 mg Oral Given 01/10/18 1441)     Initial Impression / Assessment and Plan / ED Course  I have reviewed the triage vital signs and the nursing notes.  Pertinent labs & imaging results that were available during my care of the patient were reviewed by me and considered in my medical decision making (see chart for details).     Final Clinical Impressions(s) / ED Diagnoses   Final diagnoses:  Laceration of finger  of left hand with foreign body and damage to nail, unspecified finger, initial encounter   Pt presenting for crush injury and laceration to left index finger. xray negative for bony abnormality. Pressure irrigation performed. Wound explored and base of wound visualized in a bloodless field without evidence of foreign body.  Laceration occurred < 8 hours prior to repair which was well tolerated. Tdap UTD. Pt has no comorbidities to effect normal wound healing. Pt discharged without antibiotics.  Discussed suture home care with patient and answered questions. Pt to follow-up for wound check and suture removal in 14 days; they are to return to the ED sooner for signs of infection. Pt is hemodynamically stable with no complaints prior to dc.   ED Discharge Orders    None       Karrie Meres, [place] 01/11/18 1610    Pricilla Loveless, MD 01/11/18 [phone]

## 2018-01-10 NOTE — ED Triage Notes (Signed)
Pain, swelling and laceration to index finger on l/hand. Crush injury caused by a door closing on the finger. Bleeding controlled. Family transported pt

## 2018-04-12 DIAGNOSIS — S92323A Displaced fracture of second metatarsal bone, unspecified foot, initial encounter for closed fracture: Secondary | ICD-10-CM | POA: Insufficient documentation

## 2018-04-12 HISTORY — DX: Displaced fracture of second metatarsal bone, unspecified foot, initial encounter for closed fracture: S92.323A

## 2018-06-21 ENCOUNTER — Other Ambulatory Visit: Payer: Self-pay

## 2018-06-21 ENCOUNTER — Encounter (HOSPITAL_COMMUNITY): Payer: Self-pay | Admitting: Obstetrics and Gynecology

## 2018-06-21 ENCOUNTER — Emergency Department (HOSPITAL_COMMUNITY)
Admission: EM | Admit: 2018-06-21 | Discharge: 2018-06-22 | Disposition: A | Discharge status: Home / Self Care | Payer: BLUE CROSS/BLUE SHIELD | Attending: Emergency Medicine | Admitting: Emergency Medicine

## 2018-06-21 ENCOUNTER — Emergency Department (HOSPITAL_COMMUNITY): Payer: BLUE CROSS/BLUE SHIELD

## 2018-06-21 DIAGNOSIS — F17210 Nicotine dependence, cigarettes, uncomplicated: Secondary | ICD-10-CM | POA: Diagnosis not present

## 2018-06-21 DIAGNOSIS — M25571 Pain in right ankle and joints of right foot: Secondary | ICD-10-CM | POA: Insufficient documentation

## 2018-06-21 DIAGNOSIS — M79671 Pain in right foot: Secondary | ICD-10-CM | POA: Insufficient documentation

## 2018-06-21 DIAGNOSIS — Z91010 Allergy to peanuts: Secondary | ICD-10-CM | POA: Insufficient documentation

## 2018-06-21 DIAGNOSIS — Z79899 Other long term (current) drug therapy: Secondary | ICD-10-CM | POA: Diagnosis not present

## 2018-06-21 DIAGNOSIS — X509XXA Other and unspecified overexertion or strenuous movements or postures, initial encounter: Secondary | ICD-10-CM | POA: Insufficient documentation

## 2018-06-21 DIAGNOSIS — R600 Localized edema: Secondary | ICD-10-CM | POA: Diagnosis not present

## 2018-06-21 MED ORDER — OXYCODONE-ACETAMINOPHEN 5-325 MG PO TABS
1.0000 | ORAL_TABLET | ORAL | Status: DC | PRN
  Administered 2018-06-21: 1 via ORAL
  Filled 2018-06-21: qty 1

## 2018-06-21 NOTE — ED Triage Notes (Signed)
Pt reports he broke his foot on September 11th and has been out of his boot since the end of November and he reports to stepping off the step wrong today and believes he re-injured the right foot as he heard a popping noise.

## 2018-06-22 ENCOUNTER — Emergency Department (HOSPITAL_COMMUNITY): Payer: BLUE CROSS/BLUE SHIELD

## 2018-06-22 MED ORDER — OXYCODONE-ACETAMINOPHEN 5-325 MG PO TABS: 2.0000 | ORAL_TABLET | Freq: Three times a day (TID) | ORAL | 0 refills | Status: DC | PRN

## 2018-06-22 MED ORDER — OXYCODONE-ACETAMINOPHEN 5-325 MG PO TABS
1.0000 | ORAL_TABLET | Freq: Once | ORAL | Status: AC
  Administered 2018-06-22: 1 via ORAL
  Filled 2018-06-22: qty 1

## 2018-06-22 NOTE — ED Notes (Addendum)
Pt wanting to use crutches and boot from previous visit instead of post op shoe and crutches. Ward, MD aware.

## 2018-06-22 NOTE — ED Provider Notes (Signed)
TIME SEEN: 12:40 AM  CHIEF COMPLAINT: Right foot pain  HPI: Patient is a 33 year old male who presents the emergency department the right foot pain.  States he twisted his ankle and felt a pop today.  Went down on the right knee but did not hit his head or lose consciousness.  Unable to bear weight on the right leg.  States he has pain over the dorsal foot and right medial ankle.  No numbness, weakness, discoloration.  ROS: See HPI Constitutional: no fever  Eyes: no drainage  ENT: no runny nose   Cardiovascular:  no chest pain  Resp: no SOB  GI: no vomiting GU: no dysuria Integumentary: no rash  Allergy: no hives  Musculoskeletal: no leg swelling  Neurological: no slurred speech ROS otherwise negative  PAST MEDICAL HISTORY/PAST SURGICAL HISTORY:  History reviewed. No pertinent past medical history.  MEDICATIONS:  Prior to Admission medications   Medication Sig Start Date End Date Taking? Authorizing Provider  albuterol (PROVENTIL HFA;VENTOLIN HFA) 108 (90 BASE) MCG/ACT inhaler Inhale 1-2 puffs into the lungs every 6 (six) hours as needed for wheezing. 02/23/12 02/22/13  Sunnie Nielsen, MD  EPINEPHrine 0.3 mg/0.3 mL IJ SOAJ injection Inject 0.3 mLs (0.3 mg total) into the muscle once. Patient not taking: Reported on 01/10/2018 06/03/15   Le, Thao P, DO  fluticasone (FLONASE) 50 MCG/ACT nasal spray Place 2 sprays into the nose daily.    [provider]  guaiFENesin (MUCINEX) 600 MG 12 hr tablet Take 1,200 mg by mouth 2 (two) times daily.    [provider]  HYDROcodone-acetaminophen (NORCO) 10-325 MG tablet TAKE 1 TABLET BY MOUTH EVERY 4 TO 6 HOURS AS NEEDED FOR PAIN 01/03/18   [provider]  ibuprofen (ADVIL,MOTRIN) 600 MG tablet Take 1 tablet (600 mg total) by mouth every 6 (six) hours as needed. Patient not taking: Reported on 01/10/2018 01/12/17   Graciella Freer A, PA-C  methocarbamol (ROBAXIN) 500 MG tablet Take 1 tablet (500 mg total) by mouth 2 (two) times  daily. Patient not taking: Reported on 01/10/2018 01/12/17   Maxwell Caul, PA-C    ALLERGIES:  Allergies  Allergen Reactions  . Peanut-Containing Drug Products Shortness Of Breath and Nausea And Vomiting    SOCIAL HISTORY:  Social History   Tobacco Use  . Smoking status: Current Every Day Smoker    Packs/day: 1.50    Types: Cigarettes  . Smokeless tobacco: Never Used  Substance Use Topics  . Alcohol use: No    FAMILY HISTORY: Family History  Problem Relation Age of Onset  . Coronary artery disease Other   . Cancer Other     EXAM: BP (!) 140/91 (BP Location: Left Arm)   Pulse 84   Temp 98.4 F (36.9 C) (Oral)   Resp 16   Ht 5\' 11"  (1.803 m)   Wt 83 kg   SpO2 98%   BMI 25.52 kg/m  CONSTITUTIONAL: Alert and oriented and responds appropriately to questions. Well-appearing; well-nourished HEAD: Normocephalic EYES: Conjunctivae clear, pupils appear equal, EOMI ENT: normal nose; moist mucous membranes NECK: Supple, no meningismus, no nuchal rigidity, no LAD  CARD: RRR; S1 and S2 appreciated; no murmurs, no clicks, no rubs, no gallops RESP: Normal chest excursion without splinting or tachypnea; breath sounds clear and equal bilaterally; no wheezes, no rhonchi, no rales, no hypoxia or respiratory distress, speaking full sentences ABD/GI: Normal bowel sounds; non-distended; soft, non-tender, no rebound, no guarding, no peritoneal signs, no hepatosplenomegaly BACK:  The back appears  normal and is non-tender to palpation, there is no CVA tenderness EXT: Patient is tender to palpation diffusely over the dorsal foot mostly throughout the medial aspect as well as over the right medial malleolus.  There is a small amount of soft tissue swelling noted but no ecchymosis, erythema or warmth.  He has 2+ right DP pulse.  Unable to plantar or dorsiflex the foot secondary to pain.  He can wiggle his toes.  He reports normal sensation diffusely.  No tenderness over the right knee or right  proximal fibula.  Calf is nontender.  Compartments are soft. SKIN: Normal color for age and race; warm; no rash NEURO: Moves all extremities equally PSYCH: The patient's mood and manner are appropriate. Grooming and personal hygiene are appropriate.  MEDICAL DECISION MAKING: Patient here with right foot injury.  Complaining of right foot and ankle pain.  X-ray shows diffuse osteopenia and therefore fracture cannot be excluded.  Radiology recommends CT imaging.  Will give Percocet for pain control and reassess with CT imaging.  No other sign of injury on examination today.  Neurovascularly intact distally.  ED PROGRESS: Patient CT scan shows a possible small avulsion fracture of the dorsal aspect of the base of the third metatarsal that could be chronic in nature.  Discussed this with patient he states "that is what me they told me last time and I did have having 2 fractures and torn ligaments and tendons".  He states he is followed by Dr. Victorino Dike as he states this is "still an open workers comp case".  Have given him crutches here and he states he has a boot at home to use.  Have recommended he follow-up with Dr. Victorino Dike given his continued pain.  Will discharge with prescription of Percocet.  His mother at bedside is requesting that we give him something else for pain today.  We have given him 2 Percocet tablets.  Have offered him IM injection of pain medication but he declines.   At this time, I do not feel there is any life-threatening condition present. I have reviewed and discussed all results (EKG, imaging, lab, urine as appropriate) and exam findings with patient/family. I have reviewed nursing notes and appropriate previous records.  I feel the patient is safe to be discharged home without further emergent workup and can continue workup as an outpatient as needed. Discussed usual and customary return precautions. Patient/family verbalize understanding and are comfortable with this plan.  Outpatient  follow-up has been provided as needed. All questions have been answered.      , Layla Maw, DO 06/22/18 [phone]

## 2018-06-22 NOTE — Discharge Instructions (Signed)
Please use your boot at home and crutches as needed for ambulation.  Please follow-up with Dr. Victorino Dike.

## 2019-06-02 ENCOUNTER — Emergency Department (HOSPITAL_COMMUNITY): Payer: Medicaid Other

## 2019-06-02 ENCOUNTER — Inpatient Hospital Stay (HOSPITAL_COMMUNITY)
Admission: EM | Admit: 2019-06-02 | Discharge: 2019-06-05 | DRG: 958 | Disposition: A | Discharge status: Home / Self Care | Payer: Medicaid Other | Attending: General Surgery | Admitting: General Surgery

## 2019-06-02 DIAGNOSIS — S32414A Nondisplaced fracture of anterior wall of right acetabulum, initial encounter for closed fracture: Secondary | ICD-10-CM | POA: Diagnosis present

## 2019-06-02 DIAGNOSIS — S0219XA Other fracture of base of skull, initial encounter for closed fracture: Secondary | ICD-10-CM | POA: Diagnosis present

## 2019-06-02 DIAGNOSIS — S90812A Abrasion, left foot, initial encounter: Secondary | ICD-10-CM | POA: Diagnosis present

## 2019-06-02 DIAGNOSIS — S2243XA Multiple fractures of ribs, bilateral, initial encounter for closed fracture: Secondary | ICD-10-CM | POA: Diagnosis present

## 2019-06-02 DIAGNOSIS — E8889 Other specified metabolic disorders: Secondary | ICD-10-CM | POA: Diagnosis present

## 2019-06-02 DIAGNOSIS — Z419 Encounter for procedure for purposes other than remedying health state, unspecified: Secondary | ICD-10-CM

## 2019-06-02 DIAGNOSIS — F17210 Nicotine dependence, cigarettes, uncomplicated: Secondary | ICD-10-CM | POA: Diagnosis present

## 2019-06-02 DIAGNOSIS — S2241XA Multiple fractures of ribs, right side, initial encounter for closed fracture: Secondary | ICD-10-CM

## 2019-06-02 DIAGNOSIS — S40812A Abrasion of left upper arm, initial encounter: Secondary | ICD-10-CM | POA: Diagnosis present

## 2019-06-02 DIAGNOSIS — S42022A Displaced fracture of shaft of left clavicle, initial encounter for closed fracture: Secondary | ICD-10-CM | POA: Diagnosis present

## 2019-06-02 DIAGNOSIS — F419 Anxiety disorder, unspecified: Secondary | ICD-10-CM | POA: Diagnosis present

## 2019-06-02 DIAGNOSIS — S00212A Abrasion of left eyelid and periocular area, initial encounter: Secondary | ICD-10-CM | POA: Diagnosis present

## 2019-06-02 DIAGNOSIS — D62 Acute posthemorrhagic anemia: Secondary | ICD-10-CM | POA: Diagnosis not present

## 2019-06-02 DIAGNOSIS — S32416A Nondisplaced fracture of anterior wall of unspecified acetabulum, initial encounter for closed fracture: Secondary | ICD-10-CM

## 2019-06-02 DIAGNOSIS — S0232XA Fracture of orbital floor, left side, initial encounter for closed fracture: Secondary | ICD-10-CM | POA: Diagnosis present

## 2019-06-02 DIAGNOSIS — J939 Pneumothorax, unspecified: Secondary | ICD-10-CM

## 2019-06-02 DIAGNOSIS — S32810A Multiple fractures of pelvis with stable disruption of pelvic ring, initial encounter for closed fracture: Secondary | ICD-10-CM | POA: Diagnosis present

## 2019-06-02 DIAGNOSIS — S0292XA Unspecified fracture of facial bones, initial encounter for closed fracture: Secondary | ICD-10-CM

## 2019-06-02 DIAGNOSIS — S02842A Fracture of lateral orbital wall, left side, initial encounter for closed fracture: Secondary | ICD-10-CM | POA: Diagnosis present

## 2019-06-02 DIAGNOSIS — G8929 Other chronic pain: Secondary | ICD-10-CM | POA: Diagnosis present

## 2019-06-02 DIAGNOSIS — S270XXA Traumatic pneumothorax, initial encounter: Secondary | ICD-10-CM | POA: Diagnosis present

## 2019-06-02 DIAGNOSIS — S32415A Nondisplaced fracture of anterior wall of left acetabulum, initial encounter for closed fracture: Secondary | ICD-10-CM | POA: Diagnosis present

## 2019-06-02 DIAGNOSIS — Z20828 Contact with and (suspected) exposure to other viral communicable diseases: Secondary | ICD-10-CM | POA: Diagnosis present

## 2019-06-02 DIAGNOSIS — Y92410 Unspecified street and highway as the place of occurrence of the external cause: Secondary | ICD-10-CM

## 2019-06-02 DIAGNOSIS — S0081XA Abrasion of other part of head, initial encounter: Secondary | ICD-10-CM | POA: Diagnosis present

## 2019-06-02 DIAGNOSIS — S40811A Abrasion of right upper arm, initial encounter: Secondary | ICD-10-CM | POA: Diagnosis present

## 2019-06-02 DIAGNOSIS — T148XXA Other injury of unspecified body region, initial encounter: Secondary | ICD-10-CM

## 2019-06-02 DIAGNOSIS — F1290 Cannabis use, unspecified, uncomplicated: Secondary | ICD-10-CM | POA: Diagnosis present

## 2019-06-02 DIAGNOSIS — S0240FA Zygomatic fracture, left side, initial encounter for closed fracture: Secondary | ICD-10-CM | POA: Diagnosis present

## 2019-06-02 HISTORY — DX: Nicotine dependence, unspecified, uncomplicated: F17.200

## 2019-06-02 HISTORY — DX: Cannabis use, unspecified, uncomplicated: F12.90

## 2019-06-02 HISTORY — DX: Rider (driver) (passenger) of other motorcycle injured in unspecified traffic accident, initial encounter: V29.99XA

## 2019-06-02 LAB — COMPREHENSIVE METABOLIC PANEL
ALT: 33 U/L (ref 0–44)
AST: 64 U/L — ABNORMAL HIGH (ref 15–41)
Albumin: 3.6 g/dL (ref 3.5–5.0)
Alkaline Phosphatase: 75 U/L (ref 38–126)
Anion gap: 12 (ref 5–15)
BUN: 7 mg/dL (ref 6–20)
CO2: 26 mmol/L (ref 22–32)
Calcium: 8.8 mg/dL — ABNORMAL LOW (ref 8.9–10.3)
Chloride: 99 mmol/L (ref 98–111)
Creatinine, Ser: 1.15 mg/dL (ref 0.61–1.24)
GFR calc Af Amer: >60 mL/min (ref >60)
GFR calc non Af Amer: >60 mL/min (ref >60)
Glucose, Bld: 155 mg/dL — ABNORMAL HIGH (ref 70–99)
Potassium: 3.3 mmol/L — ABNORMAL LOW (ref 3.5–5.1)
Sodium: 137 mmol/L (ref 135–145)
Total Bilirubin: 0.7 mg/dL (ref 0.3–1.2)
Total Protein: 6.2 g/dL — ABNORMAL LOW (ref 6.5–8.1)

## 2019-06-02 LAB — I-STAT CHEM 8, ED
BUN: 12 mg/dL (ref 6–20)
Calcium, Ion: 1.17 mmol/L (ref 1.15–1.40)
Chloride: 100 mmol/L (ref 98–111)
Creatinine, Ser: 1.1 mg/dL (ref 0.61–1.24)
Glucose, Bld: 135 mg/dL — ABNORMAL HIGH (ref 70–99)
HCT: 41 % (ref 39.0–52.0)
Hemoglobin: 13.9 g/dL (ref 13.0–17.0)
Potassium: 4.1 mmol/L (ref 3.5–5.1)
Sodium: 138 mmol/L (ref 135–145)
TCO2: 30 mmol/L (ref 22–32)

## 2019-06-02 LAB — PROTIME-INR
INR: 1 (ref 0.8–1.2)
Prothrombin Time: 13.6 seconds (ref 11.4–15.2)

## 2019-06-02 LAB — CBC
HCT: 40.4 % (ref 39.0–52.0)
Hemoglobin: 13.4 g/dL (ref 13.0–17.0)
MCH: 30.4 pg (ref 26.0–34.0)
MCHC: 33.2 g/dL (ref 30.0–36.0)
MCV: 91.6 fL (ref 80.0–100.0)
Platelets: 246 10*3/uL (ref 150–400)
RBC: 4.41 MIL/uL (ref 4.22–5.81)
RDW: 12.1 % (ref 11.5–15.5)
WBC: 15.1 10*3/uL — ABNORMAL HIGH (ref 4.0–10.5)
nRBC: 0 % (ref 0.0–0.2)

## 2019-06-02 LAB — SAMPLE TO BLOOD BANK

## 2019-06-02 LAB — ETHANOL: Alcohol, Ethyl (B): <10 mg/dL (ref <10)

## 2019-06-02 LAB — SARS CORONAVIRUS 2 (TAT 6-24 HRS): SARS Coronavirus 2: NEGATIVE

## 2019-06-02 LAB — LACTIC ACID, PLASMA: Lactic Acid, Venous: 1.5 mmol/L (ref 0.5–1.9)

## 2019-06-02 MED ORDER — ONDANSETRON HCL 4 MG/2ML IJ SOLN
4.0000 mg | Freq: Four times a day (QID) | INTRAMUSCULAR | Status: DC | PRN
  Administered 2019-06-03: 4 mg via INTRAVENOUS
  Filled 2019-06-02: qty 2

## 2019-06-02 MED ORDER — ONDANSETRON 4 MG PO TBDP: 4.0000 mg | ORAL_TABLET | Freq: Four times a day (QID) | ORAL | Status: DC | PRN

## 2019-06-02 MED ORDER — ENOXAPARIN SODIUM 40 MG/0.4ML ~~LOC~~ SOLN
40.0000 mg | SUBCUTANEOUS | Status: DC
  Administered 2019-06-03 – 2019-06-05 (×2): 40 mg via SUBCUTANEOUS
  Filled 2019-06-02 (×2): qty 0.4

## 2019-06-02 MED ORDER — FENTANYL CITRATE (PF) 100 MCG/2ML IJ SOLN
INTRAMUSCULAR | Status: AC
  Filled 2019-06-02: qty 2

## 2019-06-02 MED ORDER — LORAZEPAM 2 MG/ML IJ SOLN
1.0000 mg | Freq: Four times a day (QID) | INTRAMUSCULAR | Status: DC | PRN
  Administered 2019-06-03 – 2019-06-04 (×2): 1 mg via INTRAVENOUS
  Filled 2019-06-02 (×2): qty 1

## 2019-06-02 MED ORDER — OXYCODONE HCL 5 MG PO TABS: 5.0000 mg | ORAL_TABLET | ORAL | Status: DC | PRN

## 2019-06-02 MED ORDER — METOPROLOL TARTRATE 5 MG/5ML IV SOLN: 5.0000 mg | Freq: Four times a day (QID) | INTRAVENOUS | Status: DC | PRN

## 2019-06-02 MED ORDER — LORAZEPAM 2 MG/ML IJ SOLN
1.0000 mg | Freq: Once | INTRAMUSCULAR | Status: AC
  Administered 2019-06-02: 1 mg via INTRAVENOUS
  Filled 2019-06-02: qty 1

## 2019-06-02 MED ORDER — HYDROMORPHONE HCL 1 MG/ML IJ SOLN
1.0000 mg | Freq: Once | INTRAMUSCULAR | Status: AC
  Administered 2019-06-02: 1 mg via INTRAVENOUS
  Filled 2019-06-02: qty 1

## 2019-06-02 MED ORDER — ACETAMINOPHEN 500 MG PO TABS
1000.0000 mg | ORAL_TABLET | Freq: Four times a day (QID) | ORAL | Status: DC
  Administered 2019-06-03 – 2019-06-05 (×6): 1000 mg via ORAL
  Filled 2019-06-02 (×6): qty 2

## 2019-06-02 MED ORDER — NICOTINE 21 MG/24HR TD PT24
21.0000 mg | MEDICATED_PATCH | Freq: Once | TRANSDERMAL | Status: AC
  Administered 2019-06-02: 21 mg via TRANSDERMAL
  Filled 2019-06-02: qty 1

## 2019-06-02 MED ORDER — OXYCODONE HCL 5 MG PO TABS
10.0000 mg | ORAL_TABLET | ORAL | Status: DC | PRN
  Administered 2019-06-03 – 2019-06-05 (×8): 10 mg via ORAL
  Filled 2019-06-02 (×8): qty 2

## 2019-06-02 MED ORDER — BISACODYL 10 MG RE SUPP: 10.0000 mg | Freq: Every day | RECTAL | Status: DC | PRN

## 2019-06-02 MED ORDER — DOCUSATE SODIUM 100 MG PO CAPS
100.0000 mg | ORAL_CAPSULE | Freq: Two times a day (BID) | ORAL | Status: DC
  Administered 2019-06-03: 100 mg via ORAL
  Filled 2019-06-02 (×2): qty 1

## 2019-06-02 MED ORDER — MORPHINE SULFATE (PF) 2 MG/ML IV SOLN
2.0000 mg | INTRAVENOUS | Status: DC | PRN
  Administered 2019-06-02 – 2019-06-04 (×11): 2 mg via INTRAVENOUS
  Filled 2019-06-02 (×11): qty 1

## 2019-06-02 MED ORDER — SODIUM CHLORIDE 0.9 % IV SOLN
INTRAVENOUS | Status: DC
  Administered 2019-06-02 – 2019-06-04 (×3): via INTRAVENOUS

## 2019-06-02 MED ORDER — IOHEXOL 300 MG/ML  SOLN
100.0000 mL | Freq: Once | INTRAMUSCULAR | Status: AC | PRN
  Administered 2019-06-02: 100 mL via INTRAVENOUS

## 2019-06-02 MED ORDER — TETANUS-DIPHTH-ACELL PERTUSSIS 5-2.5-18.5 LF-MCG/0.5 IM SUSP: 0.5000 mL | Freq: Once | INTRAMUSCULAR | Status: DC

## 2019-06-02 MED ORDER — DTAP-IPV-HIB VACCINE IM SUSR: 0.5000 mL | Freq: Once | INTRAMUSCULAR | Status: DC

## 2019-06-02 NOTE — ED Notes (Signed)
Pt states he had a tdap within the last 2 years

## 2019-06-02 NOTE — Progress Notes (Signed)
Pt arrived to 6n10 at this time. C/O "allover" pain 10/10.Will continue to monitor at this time

## 2019-06-02 NOTE — ED Notes (Signed)
Provider initially ordered 27mcg fentenal. They changed to 131mcg and being pulled
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
## 2019-06-02 NOTE — Progress Notes (Signed)
Orthopedic Tech Progress Note Patient Details:  Chad [name] 1986/06/21 [id number]  Patient ID: Chad [name], male   DOB: 28-Jul-1985, 33 y.o.   MRN: [id number]   Chad [name] 06/02/2019, 4:51 PMLevel 2 Trauma
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
## 2019-06-02 NOTE — ED Notes (Signed)
Manual blood pressure upon arrival 130/60

## 2019-06-03 ENCOUNTER — Inpatient Hospital Stay (HOSPITAL_COMMUNITY): Payer: Medicaid Other

## 2019-06-03 ENCOUNTER — Encounter (HOSPITAL_COMMUNITY): Payer: Self-pay

## 2019-06-03 DIAGNOSIS — S42022A Displaced fracture of shaft of left clavicle, initial encounter for closed fracture: Secondary | ICD-10-CM | POA: Diagnosis present

## 2019-06-03 LAB — URINALYSIS, ROUTINE W REFLEX MICROSCOPIC
Bilirubin Urine: NEGATIVE
Glucose, UA: NEGATIVE mg/dL
Hgb urine dipstick: NEGATIVE
Ketones, ur: NEGATIVE mg/dL
Leukocytes,Ua: NEGATIVE
Nitrite: NEGATIVE
Protein, ur: NEGATIVE mg/dL
Specific Gravity, Urine: 1.028 (ref 1.005–1.030)
pH: 5 (ref 5.0–8.0)

## 2019-06-03 LAB — BASIC METABOLIC PANEL
Anion gap: 9 (ref 5–15)
BUN: 9 mg/dL (ref 6–20)
CO2: 25 mmol/L (ref 22–32)
Calcium: 9 mg/dL (ref 8.9–10.3)
Chloride: 104 mmol/L (ref 98–111)
Creatinine, Ser: 1.06 mg/dL (ref 0.61–1.24)
GFR calc Af Amer: >60 mL/min (ref >60)
GFR calc non Af Amer: >60 mL/min (ref >60)
Glucose, Bld: 154 mg/dL — ABNORMAL HIGH (ref 70–99)
Potassium: 4.1 mmol/L (ref 3.5–5.1)
Sodium: 138 mmol/L (ref 135–145)

## 2019-06-03 LAB — CDS SEROLOGY

## 2019-06-03 LAB — CBC
HCT: 40.5 % (ref 39.0–52.0)
Hemoglobin: 13.7 g/dL (ref 13.0–17.0)
MCH: 30.8 pg (ref 26.0–34.0)
MCHC: 33.8 g/dL (ref 30.0–36.0)
MCV: 91 fL (ref 80.0–100.0)
Platelets: 225 10*3/uL (ref 150–400)
RBC: 4.45 MIL/uL (ref 4.22–5.81)
RDW: 12.2 % (ref 11.5–15.5)
WBC: 13.8 10*3/uL — ABNORMAL HIGH (ref 4.0–10.5)
nRBC: 0 % (ref 0.0–0.2)

## 2019-06-03 MED ORDER — METHOCARBAMOL 1000 MG/10ML IJ SOLN
1000.0000 mg | Freq: Three times a day (TID) | INTRAVENOUS | Status: DC | PRN
  Administered 2019-06-03: 1000 mg via INTRAVENOUS
  Filled 2019-06-03 (×3): qty 10

## 2019-06-03 MED ORDER — KETOROLAC TROMETHAMINE 15 MG/ML IJ SOLN
15.0000 mg | Freq: Three times a day (TID) | INTRAMUSCULAR | Status: DC
  Administered 2019-06-03 – 2019-06-05 (×5): 15 mg via INTRAVENOUS
  Filled 2019-06-03 (×5): qty 1

## 2019-06-03 MED ORDER — METHOCARBAMOL 1000 MG/10ML IJ SOLN
1000.0000 mg | Freq: Three times a day (TID) | INTRAMUSCULAR | Status: DC | PRN
  Filled 2019-06-03: qty 10

## 2019-06-03 MED ORDER — CEFAZOLIN SODIUM-DEXTROSE 2-4 GM/100ML-% IV SOLN
2.0000 g | INTRAVENOUS | Status: AC
  Administered 2019-06-04: 2 g via INTRAVENOUS
  Filled 2019-06-03 (×2): qty 100
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
## 2019-06-04 ENCOUNTER — Inpatient Hospital Stay (HOSPITAL_COMMUNITY): Payer: Medicaid Other

## 2019-06-04 ENCOUNTER — Encounter (HOSPITAL_COMMUNITY): Admission: EM | Disposition: A | Discharge status: Home / Self Care | Payer: Self-pay | Source: Home / Self Care

## 2019-06-04 ENCOUNTER — Encounter (HOSPITAL_COMMUNITY): Payer: Self-pay

## 2019-06-04 ENCOUNTER — Inpatient Hospital Stay (HOSPITAL_COMMUNITY): Payer: Medicaid Other | Admitting: Anesthesiology

## 2019-06-04 HISTORY — PX: ORIF CLAVICULAR FRACTURE: SHX5055

## 2019-06-04 LAB — BASIC METABOLIC PANEL
Anion gap: 8 (ref 5–15)
BUN: 8 mg/dL (ref 6–20)
CO2: 24 mmol/L (ref 22–32)
Calcium: 8.6 mg/dL — ABNORMAL LOW (ref 8.9–10.3)
Chloride: 108 mmol/L (ref 98–111)
Creatinine, Ser: 0.87 mg/dL (ref 0.61–1.24)
GFR calc Af Amer: >60 mL/min (ref >60)
GFR calc non Af Amer: >60 mL/min (ref >60)
Glucose, Bld: 106 mg/dL — ABNORMAL HIGH (ref 70–99)
Potassium: 3.5 mmol/L (ref 3.5–5.1)
Sodium: 140 mmol/L (ref 135–145)

## 2019-06-04 LAB — CBC
HCT: 36 % — ABNORMAL LOW (ref 39.0–52.0)
Hemoglobin: 12.1 g/dL — ABNORMAL LOW (ref 13.0–17.0)
MCH: 30.6 pg (ref 26.0–34.0)
MCHC: 33.6 g/dL (ref 30.0–36.0)
MCV: 91.1 fL (ref 80.0–100.0)
Platelets: 199 10*3/uL (ref 150–400)
RBC: 3.95 MIL/uL — ABNORMAL LOW (ref 4.22–5.81)
RDW: 12.1 % (ref 11.5–15.5)
WBC: 9.9 10*3/uL (ref 4.0–10.5)
nRBC: 0 % (ref 0.0–0.2)

## 2019-06-04 LAB — SURGICAL PCR SCREEN
MRSA, PCR: NEGATIVE
Staphylococcus aureus: POSITIVE — AB

## 2019-06-04 LAB — PHOSPHORUS: Phosphorus: 2.3 mg/dL — ABNORMAL LOW (ref 2.5–4.6)

## 2019-06-04 LAB — MAGNESIUM: Magnesium: 1.9 mg/dL (ref 1.7–2.4)

## 2019-06-04 LAB — VITAMIN D 25 HYDROXY (VIT D DEFICIENCY, FRACTURES): Vit D, 25-Hydroxy: 24.14 ng/mL — ABNORMAL LOW (ref 30–100)

## 2019-06-04 SURGERY — OPEN REDUCTION INTERNAL FIXATION (ORIF) CLAVICULAR FRACTURE
Anesthesia: General | Site: Chest | Laterality: Left

## 2019-06-04 MED ORDER — MIDAZOLAM HCL 2 MG/2ML IJ SOLN
INTRAMUSCULAR | Status: AC
  Filled 2019-06-04: qty 2

## 2019-06-04 MED ORDER — SUCCINYLCHOLINE CHLORIDE 20 MG/ML IJ SOLN
INTRAMUSCULAR | Status: DC | PRN
  Administered 2019-06-04: 100 mg via INTRAVENOUS

## 2019-06-04 MED ORDER — BUPIVACAINE HCL (PF) 0.25 % IJ SOLN
INTRAMUSCULAR | Status: AC
  Filled 2019-06-04: qty 30

## 2019-06-04 MED ORDER — LACTATED RINGERS IV SOLN
INTRAVENOUS | Status: DC
  Administered 2019-06-04: 13:00:00 via INTRAVENOUS

## 2019-06-04 MED ORDER — HYDROMORPHONE HCL 1 MG/ML IJ SOLN: 0.2500 mg | INTRAMUSCULAR | Status: DC | PRN

## 2019-06-04 MED ORDER — METHOCARBAMOL 750 MG PO TABS: 750.0000 mg | ORAL_TABLET | Freq: Three times a day (TID) | ORAL | Status: DC

## 2019-06-04 MED ORDER — LACTATED RINGERS IV SOLN: INTRAVENOUS | Status: DC

## 2019-06-04 MED ORDER — FENTANYL CITRATE (PF) 100 MCG/2ML IJ SOLN
INTRAMUSCULAR | Status: DC | PRN
  Administered 2019-06-04: 100 ug via INTRAVENOUS
  Administered 2019-06-04: 50 ug via INTRAVENOUS
  Administered 2019-06-04: 100 ug via INTRAVENOUS

## 2019-06-04 MED ORDER — ONDANSETRON HCL 4 MG/2ML IJ SOLN
INTRAMUSCULAR | Status: AC
  Filled 2019-06-04: qty 2

## 2019-06-04 MED ORDER — ONDANSETRON HCL 4 MG/2ML IJ SOLN
INTRAMUSCULAR | Status: DC | PRN
  Administered 2019-06-04: 4 mg via INTRAVENOUS

## 2019-06-04 MED ORDER — ONDANSETRON HCL 4 MG/2ML IJ SOLN
INTRAMUSCULAR | Status: AC
  Administered 2019-06-04: 4 mg via INTRAVENOUS
  Filled 2019-06-04: qty 2

## 2019-06-04 MED ORDER — ONDANSETRON HCL 4 MG/2ML IJ SOLN
4.0000 mg | Freq: Once | INTRAMUSCULAR | Status: AC
  Filled 2019-06-04: qty 2

## 2019-06-04 MED ORDER — FENTANYL CITRATE (PF) 250 MCG/5ML IJ SOLN
INTRAMUSCULAR | Status: AC
  Filled 2019-06-04: qty 5

## 2019-06-04 MED ORDER — PROMETHAZINE HCL 25 MG/ML IJ SOLN
6.2500 mg | Freq: Once | INTRAMUSCULAR | Status: AC
  Administered 2019-06-04: 6.25 mg via INTRAVENOUS

## 2019-06-04 MED ORDER — PROMETHAZINE HCL 25 MG/ML IJ SOLN
INTRAMUSCULAR | Status: AC
  Filled 2019-06-04: qty 1

## 2019-06-04 MED ORDER — BUPIVACAINE HCL (PF) 0.5 % IJ SOLN
INTRAMUSCULAR | Status: AC
  Filled 2019-06-04: qty 30

## 2019-06-04 MED ORDER — ACETAMINOPHEN 10 MG/ML IV SOLN
1000.0000 mg | Freq: Once | INTRAVENOUS | Status: AC
  Administered 2019-06-04: 1000 mg via INTRAVENOUS
  Filled 2019-06-04: qty 100

## 2019-06-04 MED ORDER — 0.9 % SODIUM CHLORIDE (POUR BTL) OPTIME
TOPICAL | Status: DC | PRN
  Administered 2019-06-04: 1000 mL

## 2019-06-04 MED ORDER — CHLORHEXIDINE GLUCONATE CLOTH 2 % EX PADS
6.0000 | MEDICATED_PAD | Freq: Every day | CUTANEOUS | Status: DC
  Administered 2019-06-05: 6 via TOPICAL

## 2019-06-04 MED ORDER — POLYETHYLENE GLYCOL 3350 17 G PO PACK: 17.0000 g | PACK | Freq: Every day | ORAL | Status: DC | PRN

## 2019-06-04 MED ORDER — MUPIROCIN 2 % EX OINT
TOPICAL_OINTMENT | Freq: Two times a day (BID) | CUTANEOUS | Status: DC
  Administered 2019-06-04: 22:00:00 via NASAL
  Filled 2019-06-04 (×2): qty 22

## 2019-06-04 MED ORDER — METHOCARBAMOL 750 MG PO TABS
750.0000 mg | ORAL_TABLET | Freq: Three times a day (TID) | ORAL | Status: DC
  Administered 2019-06-04 – 2019-06-05 (×3): 750 mg via ORAL
  Filled 2019-06-04 (×3): qty 1

## 2019-06-04 MED ORDER — MIDAZOLAM HCL 5 MG/5ML IJ SOLN
INTRAMUSCULAR | Status: DC | PRN
  Administered 2019-06-04: 2 mg via INTRAVENOUS

## 2019-06-04 MED ORDER — K PHOS MONO-SOD PHOS DI & MONO 155-852-130 MG PO TABS
250.0000 mg | ORAL_TABLET | Freq: Two times a day (BID) | ORAL | Status: AC
  Administered 2019-06-04: 250 mg via ORAL
  Filled 2019-06-04 (×2): qty 1

## 2019-06-04 MED ORDER — ROCURONIUM BROMIDE 10 MG/ML (PF) SYRINGE
PREFILLED_SYRINGE | INTRAVENOUS | Status: AC
  Filled 2019-06-04: qty 20

## 2019-06-04 MED ORDER — DEXMEDETOMIDINE HCL 200 MCG/2ML IV SOLN
INTRAVENOUS | Status: DC | PRN
  Administered 2019-06-04 (×2): 20 ug via INTRAVENOUS

## 2019-06-04 MED ORDER — ROCURONIUM BROMIDE 10 MG/ML (PF) SYRINGE
PREFILLED_SYRINGE | INTRAVENOUS | Status: DC | PRN
  Administered 2019-06-04: 50 mg via INTRAVENOUS
  Administered 2019-06-04: 20 mg via INTRAVENOUS

## 2019-06-04 MED ORDER — SUGAMMADEX SODIUM 200 MG/2ML IV SOLN
INTRAVENOUS | Status: DC | PRN
  Administered 2019-06-04: 170 mg via INTRAVENOUS

## 2019-06-04 MED ORDER — PROPOFOL 10 MG/ML IV BOLUS
INTRAVENOUS | Status: AC
  Filled 2019-06-04: qty 20

## 2019-06-04 MED ORDER — LIDOCAINE 2% (20 MG/ML) 5 ML SYRINGE
INTRAMUSCULAR | Status: DC | PRN
  Administered 2019-06-04: 60 mg via INTRAVENOUS

## 2019-06-04 MED ORDER — DEXAMETHASONE SODIUM PHOSPHATE 10 MG/ML IJ SOLN
INTRAMUSCULAR | Status: DC | PRN
  Administered 2019-06-04: 10 mg via INTRAVENOUS

## 2019-06-04 MED ORDER — MORPHINE SULFATE (PF) 2 MG/ML IV SOLN
2.0000 mg | INTRAVENOUS | Status: DC | PRN
  Administered 2019-06-04 – 2019-06-05 (×2): 2 mg via INTRAVENOUS
  Filled 2019-06-04 (×2): qty 1

## 2019-06-04 MED ORDER — DIPHENHYDRAMINE HCL 25 MG PO CAPS: 25.0000 mg | ORAL_CAPSULE | Freq: Every evening | ORAL | Status: DC | PRN

## 2019-06-04 MED ORDER — ACETAMINOPHEN 10 MG/ML IV SOLN
INTRAVENOUS | Status: AC
  Filled 2019-06-04: qty 100

## 2019-06-04 MED ORDER — AMOXICILLIN-POT CLAVULANATE 875-125 MG PO TABS
1.0000 | ORAL_TABLET | Freq: Two times a day (BID) | ORAL | Status: DC
  Administered 2019-06-04 – 2019-06-05 (×3): 1 via ORAL
  Filled 2019-06-04 (×3): qty 1

## 2019-06-04 MED ORDER — PROPOFOL 10 MG/ML IV BOLUS
INTRAVENOUS | Status: DC | PRN
  Administered 2019-06-04: 200 mg via INTRAVENOUS

## 2019-06-04 SURGICAL SUPPLY — 50 items
BIT DRILL 2.8X5 QR DISP (BIT) ×2 IMPLANT
BNDG COHESIVE 4X5 TAN STRL (GAUZE/BANDAGES/DRESSINGS) IMPLANT
BRUSH SCRUB EZ PLAIN DRY (MISCELLANEOUS) ×6 IMPLANT
COVER SURGICAL LIGHT HANDLE (MISCELLANEOUS) ×6 IMPLANT
COVER WAND RF STERILE (DRAPES) ×3 IMPLANT
DRAPE C-ARM 42X72 X-RAY (DRAPES) ×3 IMPLANT
DRAPE INCISE IOBAN 66X45 STRL (DRAPES) ×3 IMPLANT
DRAPE ORTHO SPLIT 77X108 STRL (DRAPES) ×6
DRAPE SURG ORHT 6 SPLT 77X108 (DRAPES) IMPLANT
DRAPE U-SHAPE 47X51 STRL (DRAPES) ×6 IMPLANT
DRSG MEPILEX BORDER 4X8 (GAUZE/BANDAGES/DRESSINGS) ×3 IMPLANT
ELECT REM PT RETURN 9FT ADLT (ELECTROSURGICAL) ×3
ELECTRODE REM PT RTRN 9FT ADLT (ELECTROSURGICAL) ×1 IMPLANT
GLOVE BIO SURGEON STRL SZ8 (GLOVE) ×3 IMPLANT
GLOVE BIOGEL PI IND STRL 7.5 (GLOVE) ×1 IMPLANT
GLOVE BIOGEL PI IND STRL 8 (GLOVE) ×1 IMPLANT
GLOVE BIOGEL PI INDICATOR 7.5 (GLOVE) ×2
GLOVE BIOGEL PI INDICATOR 8 (GLOVE) ×2
GOWN STRL REUS W/ TWL LRG LVL3 (GOWN DISPOSABLE) ×2 IMPLANT
GOWN STRL REUS W/ TWL XL LVL3 (GOWN DISPOSABLE) ×1 IMPLANT
GOWN STRL REUS W/TWL LRG LVL3 (GOWN DISPOSABLE) ×6
GOWN STRL REUS W/TWL XL LVL3 (GOWN DISPOSABLE) ×3
KIT BASIN OR (CUSTOM PROCEDURE TRAY) ×3 IMPLANT
KIT TURNOVER KIT B (KITS) ×3 IMPLANT
MANIFOLD NEPTUNE II (INSTRUMENTS) ×3 IMPLANT
NDL HYPO 25GX1X1/2 BEV (NEEDLE) IMPLANT
NEEDLE HYPO 25GX1X1/2 BEV (NEEDLE) IMPLANT
NS IRRIG 1000ML POUR BTL (IV SOLUTION) ×3 IMPLANT
PACK GENERAL/GYN (CUSTOM PROCEDURE TRAY) ×3 IMPLANT
PAD ARMBOARD 7.5X6 YLW CONV (MISCELLANEOUS) ×6 IMPLANT
PLATE CLAVICLE LATERAL (Plate) ×2 IMPLANT
SCREW HEXALOBE LOCKING 3.5X14M (Screw) ×2 IMPLANT
SCREW HEXALOBE NON-LOCK 3.5X14 (Screw) ×6 IMPLANT
SCREW HEXALOBE NON-LOCK 3.5X16 (Screw) ×2 IMPLANT
SCREW LOCK 12X3.5X HEXALOBE (Screw) IMPLANT
SCREW LOCKING 3.5X10MM (Screw) ×6 IMPLANT
SCREW LOCKING 3.5X12 (Screw) ×3 IMPLANT
SCREW NONLOCK HEX 3.5X12 (Screw) ×2 IMPLANT
SLING ARM IMMOBILIZER LRG (SOFTGOODS) IMPLANT
SLING ARM IMMOBILIZER MED (SOFTGOODS) IMPLANT
STAPLER VISISTAT 35W (STAPLE) ×3 IMPLANT
STOCKINETTE IMPERVIOUS 9X36 MD (GAUZE/BANDAGES/DRESSINGS) IMPLANT
SUCTION FRAZIER HANDLE 10FR (MISCELLANEOUS) ×2
SUCTION TUBE FRAZIER 10FR DISP (MISCELLANEOUS) ×1 IMPLANT
SUT MNCRL AB 3-0 PS2 27 (SUTURE) ×3 IMPLANT
SUT VIC AB 2-0 CT1 27 (SUTURE) ×3
SUT VIC AB 2-0 CT1 TAPERPNT 27 (SUTURE) ×1 IMPLANT
TOWEL GREEN STERILE (TOWEL DISPOSABLE) ×3 IMPLANT
TOWEL GREEN STERILE FF (TOWEL DISPOSABLE) ×6 IMPLANT
WATER STERILE IRR 1000ML POUR (IV SOLUTION) ×3 IMPLANT

## 2019-06-04 NOTE — Anesthesia Postprocedure Evaluation (Signed)
Anesthesia Post Note  Patient: Chad [name]  Procedure(s) Performed: OPEN REDUCTION INTERNAL FIXATION LEFT CLAVICULAR FRACTURE (Left Chest)     Patient location during evaluation: PACU Anesthesia Type: General Level of consciousness: awake and alert Pain management: pain level controlled Vital Signs Assessment: post-procedure vital signs reviewed and stable Respiratory status: spontaneous breathing, nonlabored ventilation, respiratory function stable and patient connected to nasal cannula oxygen Cardiovascular status: blood pressure returned to baseline and stable Postop Assessment: no apparent nausea or vomiting Anesthetic complications: no    Last Vitals:  Vitals:   06/04/19 1610 06/04/19 1623  BP: 129/82 119/79  Pulse: 72 94  Resp: 16 17  Temp: 36.8 C   SpO2: 95% 95%    Last Pain:  Vitals:   06/04/19 1623  TempSrc:   PainSc: Asleep                 Wright Gravely,W. EDMOND

## 2019-06-04 NOTE — Progress Notes (Signed)
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 inflammation. The extraocular muscles optic nerve are unremarkable. The globe is intact. Normal right globe and orbit. Sinuses: Dependent hemorrhage noted in the left maxillary sinus. Is also anteromedial left maxillary sinus mucosal thickening with mild left ethmoid sinus mucosal thickening. Remaining sinuses are clear. Clear mastoid air cells and middle ear cavities. Soft tissues: Soft tissue hemorrhage/edema extends along the left cheek for the left temporal region. No other soft tissue abnormality. CT CERVICAL SPINE FINDINGS Alignment: Normal. Skull base and vertebrae: No acute fracture. No primary bone lesion or focal pathologic process. 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 Soft tissues and spinal canal: No prevertebral fluid or swelling. No visible canal hematoma. 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 Remaining cervical disc spaces are well preserved. No evidence of a disc herniation. No disc bulging. Central spinal canal and neural foramina are well preserved. Upper chest: No acute findings. 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 Other: None. IMPRESSION: HEAD CT 1. No intracranial abnormality. 2. No skull fracture. MAXILLOFACIAL CT 1.  Multiple left-sided facial fractures including left orbital floor, anterior and lateral left maxillary sinus walls and left-sided zygoma. 2. Preseptal left periorbital soft tissue edema/hemorrhage soft tissue air with edema/hemorrhage extending over the left cheek and temporal region. Small amount extraconal postseptal left lateral orbital edema/hemorrhage and air. No injury to the left globe or intraconal left orbit. 3. Dependent hemorrhage in the left maxillary sinus. CERVICAL CT 1. No fracture or acute finding. 2. 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 No other skeletal abnormalities. Electronically Signed   By: Amie Portland M.D.   On: 06/02/2019 17:25    Anti-infectives: Anti-infectives (From admission, onward)   Start     Dose/Rate Route Frequency Ordered Stop   06/04/19 1200  ceFAZolin (ANCEF) IVPB 2g/100 mL premix     2 g 200 mL/hr over 30 Minutes Intravenous On call 06/03/19 1424 06/05/19 1200       Assessment/Plan [facility] Left sided facial fx- per Dr. Kenney Houseman, augmentin x1 week, sinus precautions, f/u 7-10 days Left clavicle fx- OR today with Dr. Carola Frost Right 2-7 rib fx with minimal ptx and subq air, left 2-4 rib fx- repeat CXR stable without PNX. Continue pain control and pulm toilet Pelvic fx - per Dr. Magnus Ivan, nonop, WBAT BLE Tobacco abuse THC use Anxiety  ID - none FEN - IVF, NPO for surgery VTE - SCDs, lovenox Foley - none Follow up - ortho, ENT  Plan - OR today with Dr. Carola Frost. PT/OT. Add scheduled robaxin to tylenol and toradol for better pain control. Labs in AM.   LOS: 2 days    Franne Forts, [facility] Surgery 06/04/2019, 8:56 AM Please see Amion for pager number during day hours 7:00am-4:30pm

## 2019-06-04 NOTE — Progress Notes (Signed)
Report given Heath Lark, RN in PACU.

## 2019-06-04 NOTE — Anesthesia Preprocedure Evaluation (Addendum)
Anesthesia Evaluation  Patient identified by MRN, date of birth, ID band Patient awake    Reviewed: Allergy & Precautions, H&P , NPO status , Patient's Chart, lab work & pertinent test results  Airway Mallampati: III  TM Distance: >3 FB Neck ROM: Full    Dental no notable dental hx. (+) Teeth Intact, Dental Advisory Given   Pulmonary Current Smoker and Patient abstained from smoking.,    Pulmonary exam normal breath sounds clear to auscultation       Cardiovascular negative cardio ROS   Rhythm:Regular Rate:Normal     Neuro/Psych negative neurological ROS  negative psych ROS   GI/Hepatic negative GI ROS, (+)     substance abuse  marijuana use,   Endo/Other  negative endocrine ROS  Renal/GU negative Renal ROS  negative genitourinary   Musculoskeletal   Abdominal   Peds  Hematology negative hematology ROS (+)   Anesthesia Other Findings   Reproductive/Obstetrics negative OB ROS                            Anesthesia Physical Anesthesia Plan  ASA: II  Anesthesia Plan: General   Post-op Pain Management:    Induction: Intravenous  PONV Risk Score and Plan: 2 and Ondansetron, Dexamethasone and Midazolam  Airway Management Planned: Oral ETT  Additional Equipment:   Intra-op Plan:   Post-operative Plan: Extubation in OR  Informed Consent: I have reviewed the patients History and Physical, chart, labs and discussed the procedure including the risks, benefits and alternatives for the proposed anesthesia with the patient or authorized representative who has indicated his/her understanding and acceptance.     Dental advisory given  Plan Discussed with: CRNA  Anesthesia Plan Comments:         Anesthesia Quick Evaluation
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
## 2019-06-04 NOTE — Anesthesia Procedure Notes (Signed)
Procedure Name: Intubation Date/Time: 06/04/2019 1:09 PM Performed by: Kyung Rudd, CRNA Pre-anesthesia Checklist: Patient identified, Emergency Drugs available, Suction available and Patient being monitored Patient Re-evaluated:Patient Re-evaluated prior to induction Oxygen Delivery Method: Circle system utilized Preoxygenation: Pre-oxygenation with 100% oxygen Induction Type: IV induction, Rapid sequence and Cricoid Pressure applied Laryngoscope Size: Mac and 4 Grade View: Grade I Tube type: Oral Tube size: 7.5 mm Number of attempts: 1 Airway Equipment and Method: Stylet Placement Confirmation: ETT inserted through vocal cords under direct vision,  positive ETCO2 and breath sounds checked- equal and bilateral Secured at: 21 cm Tube secured with: Tape Dental Injury: Teeth and Oropharynx as per pre-operative assessment

## 2019-06-04 NOTE — Progress Notes (Signed)
Orthopedic Tech Progress Note Patient Details:  Chad [name] [birth year] [id number] Applied shoulder immobilizer in PACU  Ortho Devices Type of Ortho Device: Shoulder immobilizer Ortho Device/Splint Location: LUE Ortho Device/Splint Interventions: Application, Ordered   Post Interventions Patient Tolerated: Well Instructions Provided: Care of device, Adjustment of device   Janit Pagan 06/04/2019, 4:43 PM

## 2019-06-04 NOTE — Transfer of Care (Signed)
Immediate Anesthesia Transfer of Care Note  Patient: Chad [name]  Procedure(s) Performed: OPEN REDUCTION INTERNAL FIXATION LEFT CLAVICULAR FRACTURE (Left Chest)  Patient Location: PACU  Anesthesia Type:General  Level of Consciousness: drowsy and patient cooperative  Airway & Oxygen Therapy: Patient Spontanous Breathing and Patient connected to face mask oxygen  Post-op Assessment: Report given to RN and Post -op Vital signs reviewed and stable  Post vital signs: Reviewed and stable  Last Vitals:  Vitals Value Taken Time  BP 133/88 06/04/19 1525  Temp    Pulse 83 06/04/19 1527  Resp 19 06/04/19 1527  SpO2 89 % 06/04/19 1527  Vitals shown include unvalidated device data.  Last Pain:  Vitals:   06/04/19 0942  TempSrc:   PainSc: 7          Complications: No apparent anesthesia complications
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
## 2019-06-05 ENCOUNTER — Other Ambulatory Visit: Payer: Self-pay

## 2019-06-05 ENCOUNTER — Encounter (HOSPITAL_COMMUNITY): Payer: Self-pay | Admitting: Obstetrics and Gynecology

## 2019-06-05 ENCOUNTER — Encounter: Payer: Self-pay | Admitting: *Deleted

## 2019-06-05 ENCOUNTER — Encounter (HOSPITAL_COMMUNITY): Payer: Self-pay | Admitting: Emergency Medicine

## 2019-06-05 DIAGNOSIS — R0789 Other chest pain: Secondary | ICD-10-CM | POA: Insufficient documentation

## 2019-06-05 DIAGNOSIS — Z5321 Procedure and treatment not carried out due to patient leaving prior to being seen by health care provider: Secondary | ICD-10-CM | POA: Insufficient documentation

## 2019-06-05 LAB — BASIC METABOLIC PANEL
Anion gap: 7 (ref 5–15)
BUN: 9 mg/dL (ref 6–20)
CO2: 26 mmol/L (ref 22–32)
Calcium: 8.5 mg/dL — ABNORMAL LOW (ref 8.9–10.3)
Chloride: 105 mmol/L (ref 98–111)
Creatinine, Ser: 0.9 mg/dL (ref 0.61–1.24)
GFR calc Af Amer: >60 mL/min (ref >60)
GFR calc non Af Amer: >60 mL/min (ref >60)
Glucose, Bld: 123 mg/dL — ABNORMAL HIGH (ref 70–99)
Potassium: 3.8 mmol/L (ref 3.5–5.1)
Sodium: 138 mmol/L (ref 135–145)

## 2019-06-05 LAB — CBC
HCT: 34.1 % — ABNORMAL LOW (ref 39.0–52.0)
Hemoglobin: 11.4 g/dL — ABNORMAL LOW (ref 13.0–17.0)
MCH: 30.5 pg (ref 26.0–34.0)
MCHC: 33.4 g/dL (ref 30.0–36.0)
MCV: 91.2 fL (ref 80.0–100.0)
Platelets: 204 10*3/uL (ref 150–400)
RBC: 3.74 MIL/uL — ABNORMAL LOW (ref 4.22–5.81)
RDW: 12.1 % (ref 11.5–15.5)
WBC: 13.2 10*3/uL — ABNORMAL HIGH (ref 4.0–10.5)
nRBC: 0 % (ref 0.0–0.2)

## 2019-06-05 LAB — PHOSPHORUS: Phosphorus: 2.9 mg/dL (ref 2.5–4.6)

## 2019-06-05 LAB — MAGNESIUM: Magnesium: 2 mg/dL (ref 1.7–2.4)

## 2019-06-05 MED ORDER — MORPHINE SULFATE (PF) 2 MG/ML IV SOLN: 2.0000 mg | INTRAVENOUS | Status: DC | PRN

## 2019-06-05 MED ORDER — METHOCARBAMOL 750 MG PO TABS: 750.0000 mg | ORAL_TABLET | Freq: Three times a day (TID) | ORAL | 0 refills | Status: DC | PRN

## 2019-06-05 MED ORDER — OXYCODONE HCL 5 MG PO TABS: 5.0000 mg | ORAL_TABLET | Freq: Four times a day (QID) | ORAL | 0 refills | Status: DC | PRN

## 2019-06-05 MED ORDER — POLYETHYLENE GLYCOL 3350 17 G PO PACK: 17.0000 g | PACK | Freq: Every day | ORAL | 0 refills | Status: DC | PRN

## 2019-06-05 MED ORDER — AMOXICILLIN-POT CLAVULANATE 875-125 MG PO TABS: 1.0000 | ORAL_TABLET | Freq: Two times a day (BID) | ORAL | 0 refills | Status: DC

## 2019-06-05 MED ORDER — ACETAMINOPHEN 325 MG PO TABS: 650.0000 mg | ORAL_TABLET | Freq: Four times a day (QID) | ORAL | Status: DC | PRN

## 2019-06-05 MED ORDER — DOCUSATE SODIUM 100 MG PO CAPS: 100.0000 mg | ORAL_CAPSULE | Freq: Two times a day (BID) | ORAL | 0 refills | Status: DC | PRN

## 2019-06-05 MED ORDER — POLYETHYLENE GLYCOL 3350 17 G PO PACK: 17.0000 g | PACK | Freq: Every day | ORAL | Status: DC
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
## 2019-06-05 NOTE — Discharge Instructions (Signed)
For facial fractures: 1. Take antibiotics (augmentin) as prescribed 2. Sinus precautions to include no smoking, no blowing nose, open mouth sneezes until further notice. 3. Follow up with Dr. Kenney Houseman in 7-10 days, please call [phone] to schedule appt.   RIB FRACTURES  HOME INSTRUCTIONS   1. PAIN CONTROL:  1. Pain is best controlled by a usual combination of three different methods TOGETHER:  i. Ice/Heat ii. Over the counter pain medication iii. Prescription pain medication 2. You may experience some swelling and bruising in area of broken ribs. Ice packs or heating pads (30-60 minutes up to 6 times a day) will help. Use ice for the first few days to help decrease swelling and bruising, then switch to heat to help relax tight/sore spots and speed recovery. Some people prefer to use ice alone, heat alone, alternating between ice & heat. Experiment to what works for you. Swelling and bruising can take several weeks to resolve.  3. It is helpful to take an over-the-counter pain medication regularly for the first few weeks. Choose one of the following that works best for you:  i. Naproxen (Aleve, etc) Two 220mg  tabs twice a day ii. Ibuprofen (Advil, etc) Three 200mg  tabs four times a day (every meal & bedtime) iii. Acetaminophen (Tylenol, etc) 500-650mg  four times a day (every meal & bedtime) 4. A prescription for pain medication (such as oxycodone, hydrocodone, etc) may be given to you upon discharge. Take your pain medication as prescribed.  i. If you are having problems/concerns with the prescription medicine (does not control pain, nausea, vomiting, rash, itching, etc), please call [phone] to see if we need to switch you to a different pain medicine that will work better for you and/or control your side effect better. ii. If you need a refill on your pain medication, please contact your pharmacy. They will contact our office to request authorization. Prescriptions will not be filled  after 5 pm or on week-ends. 1. Avoid getting constipated. When taking pain medications, it is common to experience some constipation. Increasing fluid intake and taking a fiber supplement (such as Metamucil, Citrucel, FiberCon, MiraLax, etc) 1-2 times a day regularly will usually help prevent this problem from occurring. A mild laxative (prune juice, Milk of Magnesia, MiraLax, etc) should be taken according to package directions if there are no bowel movements after 48 hours.  2. Watch out for diarrhea. If you have many loose bowel movements, simplify your diet to bland foods & liquids for a few days. Stop any stool softeners and decrease your fiber supplement. Switching to mild anti-diarrheal medications (Kayopectate, Pepto Bismol) can help. If this worsens or does not improve, please call us.  WHEN TO CALL [phone]:  1. Poor pain control 2. Reactions / problems with new medications (rash/itching, nausea, etc)  3. Fever over 101.5 F (38.5 C) 4. Worsening swelling or bruising 5. Worsening pain, productive cough, difficulty breathing or any other concerning symptoms  The clinic staff is available to answer your questions during regular business hours (8:30am-5pm). Please don't hesitate to call and ask to speak to one of our nurses for clinical concerns.  If you have a medical emergency, go to the nearest emergency room or call 911.  A surgeon from [facility] is always on call at the [facility], [facility]  [address], [address], [place], [place] ?  MAIN: [phone] ? TOLL FREE: [phone] ?  FAX [phone]  [email or web address]  Information on Rib Fractures  A rib fracture is a break or crack in one of the bones of the ribs. The ribs are long, curved bones that wrap around your chest and attach to your spine and your breastbone. The ribs protect your heart, lungs, and other organs in the chest. A broken or  cracked rib is often painful but is not usually serious. Most rib fractures heal on their own over time. However, rib fractures can be more serious if multiple ribs are broken or if broken ribs move out of place and push against other structures or organs. What are the causes? This condition is caused by:  Repetitive movements with high force, such as pitching a baseball or having severe coughing spells.  A direct blow to the chest, such as a sports injury, a car accident, or a fall.  Cancer that has spread to the bones, which can weaken bones and cause them to break. What are the signs or symptoms? Symptoms of this condition include:  Pain when you breathe in or cough.  Pain when someone presses on the injured area.  Feeling short of breath. How is this diagnosed? This condition is diagnosed with a physical exam and medical history. Imaging tests may also be done, such as:  Chest X-ray.  CT scan.  MRI.  Bone scan.  Chest ultrasound. How is this treated? Treatment for this condition depends on the severity of the fracture. Most rib fractures usually heal on their own in 1-3 months. Sometimes healing takes longer if there is a cough that does not stop or if there are other activities that make the injury worse (aggravating factors). While you heal, you will be given medicines to control the pain. You will also be taught deep breathing exercises. Severe injuries may require hospitalization or surgery. Follow these instructions at home: Managing pain, stiffness, and swelling  If directed, apply ice to the injured area. ? Put ice in a plastic bag. ? Place a towel between your skin and the bag. ? Leave the ice on for 20 minutes, 2-3 times a day.  Take over-the-counter and prescription medicines only as told by your health care provider. Activity  Avoid a lot of activity and any activities or movements that cause pain. Be careful during activities and avoid bumping the injured  rib.  Slowly increase your activity as told by your health care provider. General instructions  Do deep breathing exercises as told by your health care provider. This helps prevent pneumonia, which is a common complication of a broken rib. Your health care provider may instruct you to: ? Take deep breaths several times a day. ? Try to cough several times a day, holding a pillow against the injured area. ? Use a device called incentive spirometer to practice deep breathing several times a day.  Drink enough fluid to keep your urine pale yellow.  Do not wear a rib belt or binder. These restrict breathing, which can lead to pneumonia.  Keep all follow-up visits as told by your health care provider. This is important. Contact a health care provider if:  You have a fever. Get help right away if:  You have difficulty breathing or you are short of breath.  You develop a cough that does not stop, or you cough up thick or bloody sputum.  You have nausea, vomiting, or pain in your abdomen.  Your pain gets worse and medicine does not help. Summary  A rib fracture is a break or crack  in one of the bones of the ribs.  A broken or cracked rib is often painful but is not usually serious.  Most rib fractures heal on their own over time.  Treatment for this condition depends on the severity of the fracture.  Avoid a lot of activity and any activities or movements that cause pain. This information is not intended to replace advice given to you by your health care provider. Make sure you discuss any questions you have with your health care provider. Document Released: 06/07/2005 Document Revised: 09/06/2016 Document Reviewed: 09/06/2016 Elsevier Interactive Patient Education  2019 [employer]
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
## 2019-06-05 NOTE — Plan of Care (Signed)
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
## 2019-06-06 ENCOUNTER — Emergency Department (HOSPITAL_COMMUNITY)
Admission: EM | Admit: 2019-06-06 | Discharge: 2019-06-06 | Disposition: A | Discharge status: Home / Self Care | Payer: BLUE CROSS/BLUE SHIELD | Attending: Emergency Medicine | Admitting: Emergency Medicine
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
## 2019-06-27 ENCOUNTER — Other Ambulatory Visit: Payer: Self-pay

## 2019-06-27 ENCOUNTER — Ambulatory Visit (INDEPENDENT_AMBULATORY_CARE_PROVIDER_SITE_OTHER): Payer: Self-pay | Admitting: Primary Care

## 2019-06-27 DIAGNOSIS — Z7689 Persons encountering health services in other specified circumstances: Secondary | ICD-10-CM

## 2019-06-27 DIAGNOSIS — F17200 Nicotine dependence, unspecified, uncomplicated: Secondary | ICD-10-CM

## 2019-06-27 DIAGNOSIS — T07XXXA Unspecified multiple injuries, initial encounter: Secondary | ICD-10-CM

## 2019-06-27 DIAGNOSIS — Z09 Encounter for follow-up examination after completed treatment for conditions other than malignant neoplasm: Secondary | ICD-10-CM
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
## 2020-10-24 IMAGING — DX DG CLAVICLE*L*
3 series · 3 of 3 positions shown · non-contrast
Comparison: 06/03/2019

CLINICAL DATA: Left clavicle ORIF

EXAM:
LEFT CLAVICLE - 2+ VIEWS

[clavicle ap (1 of 2)]
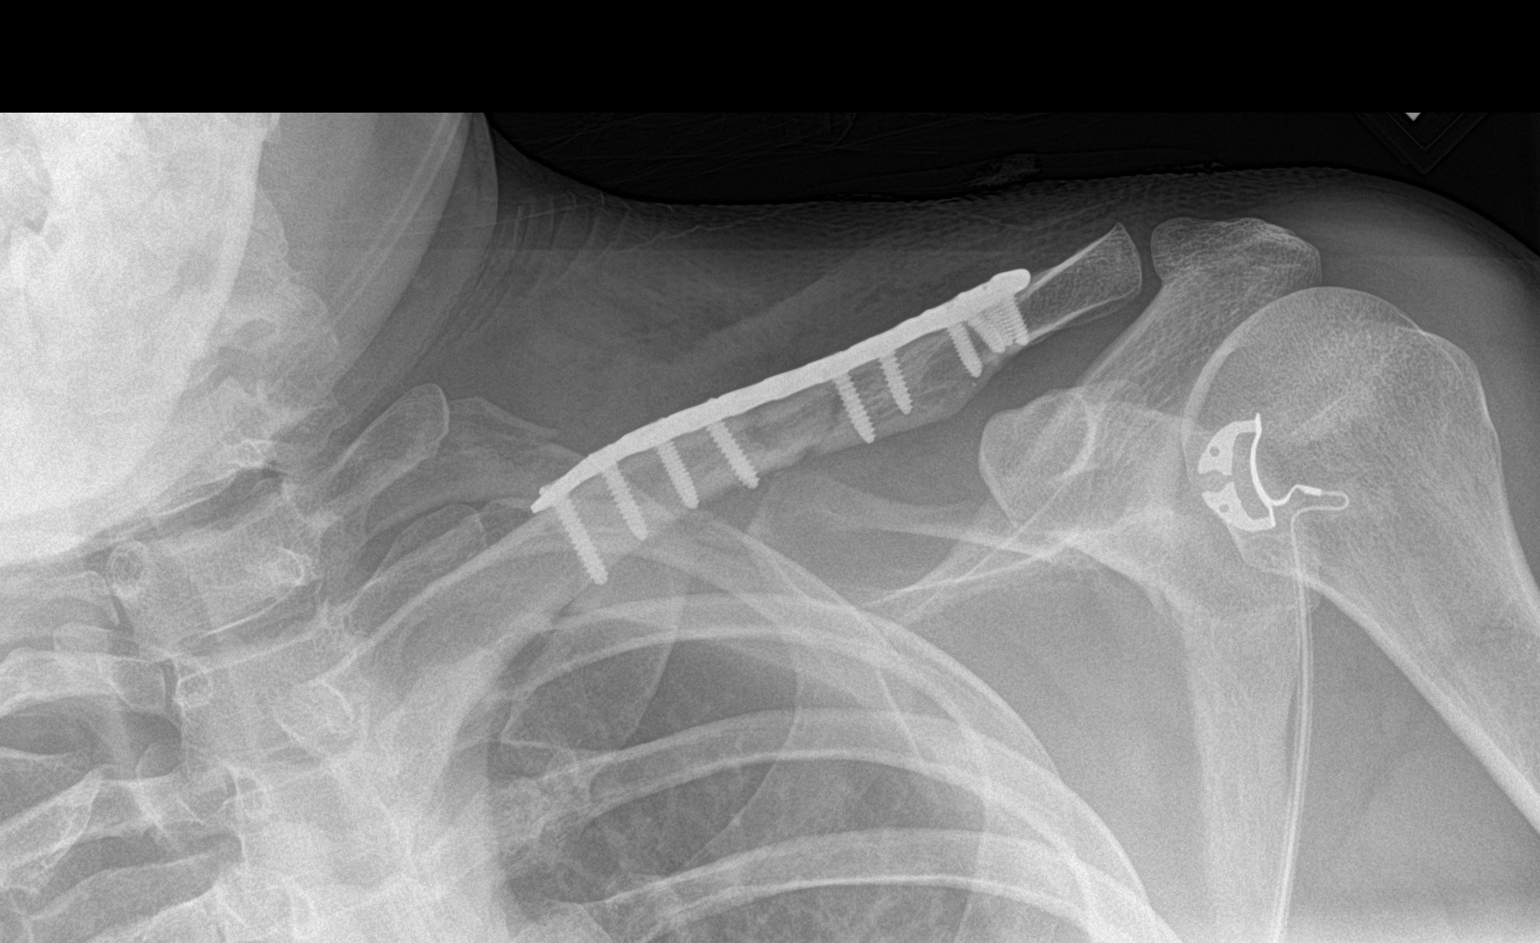

[clavicle axial]
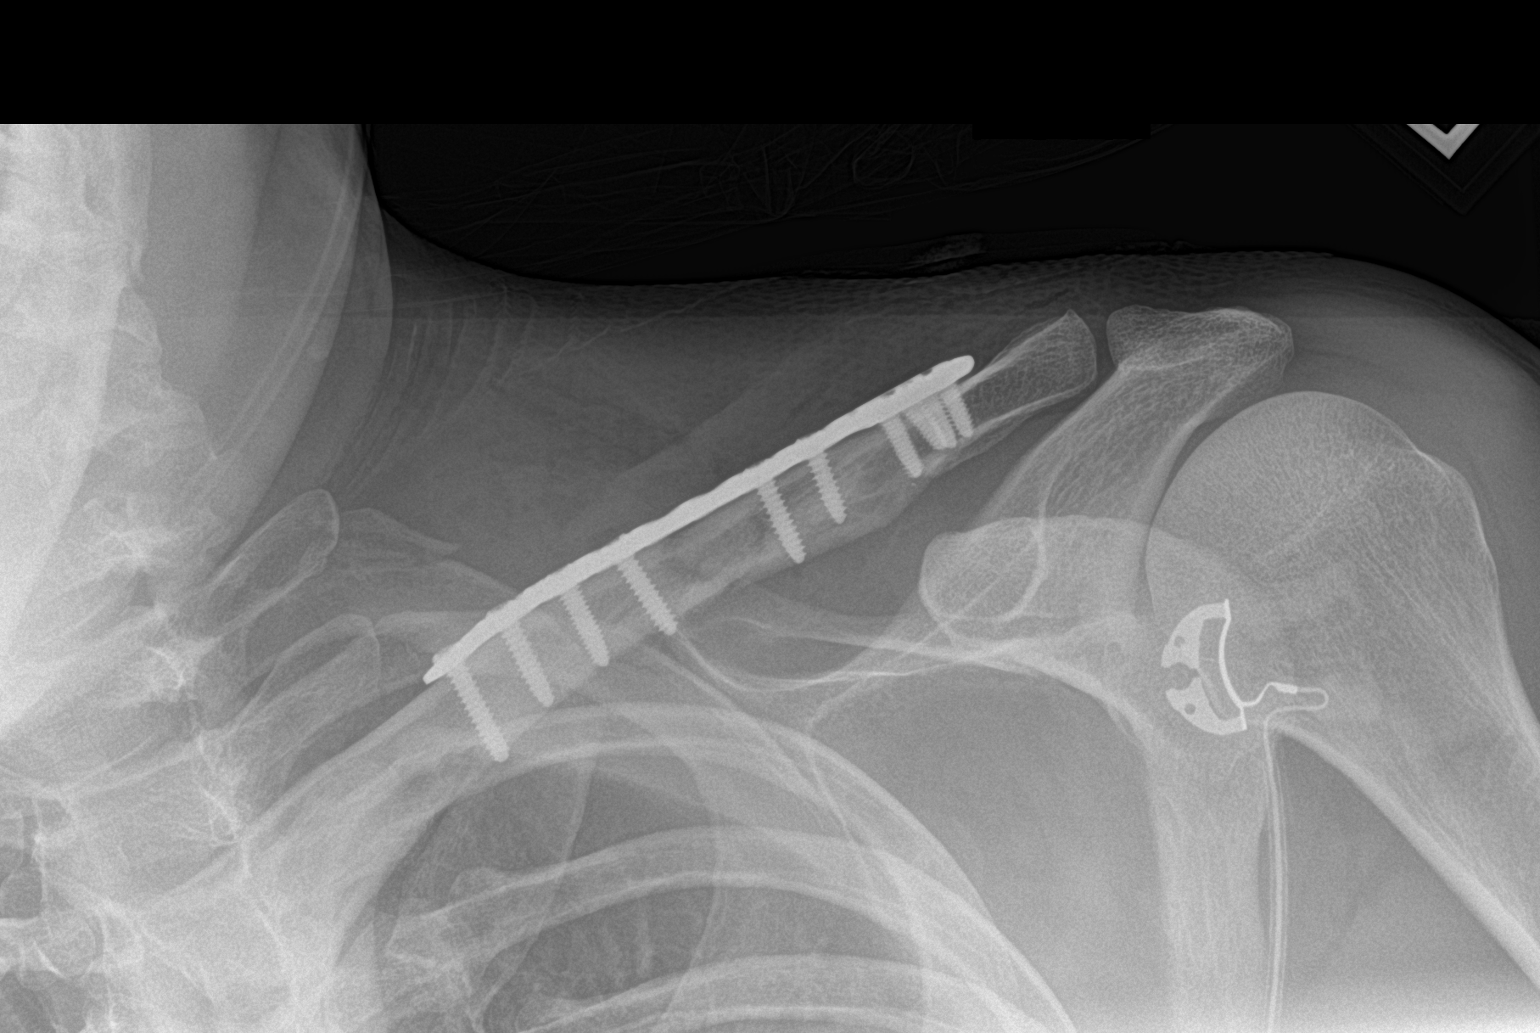

[clavicle ap (2 of 2)]
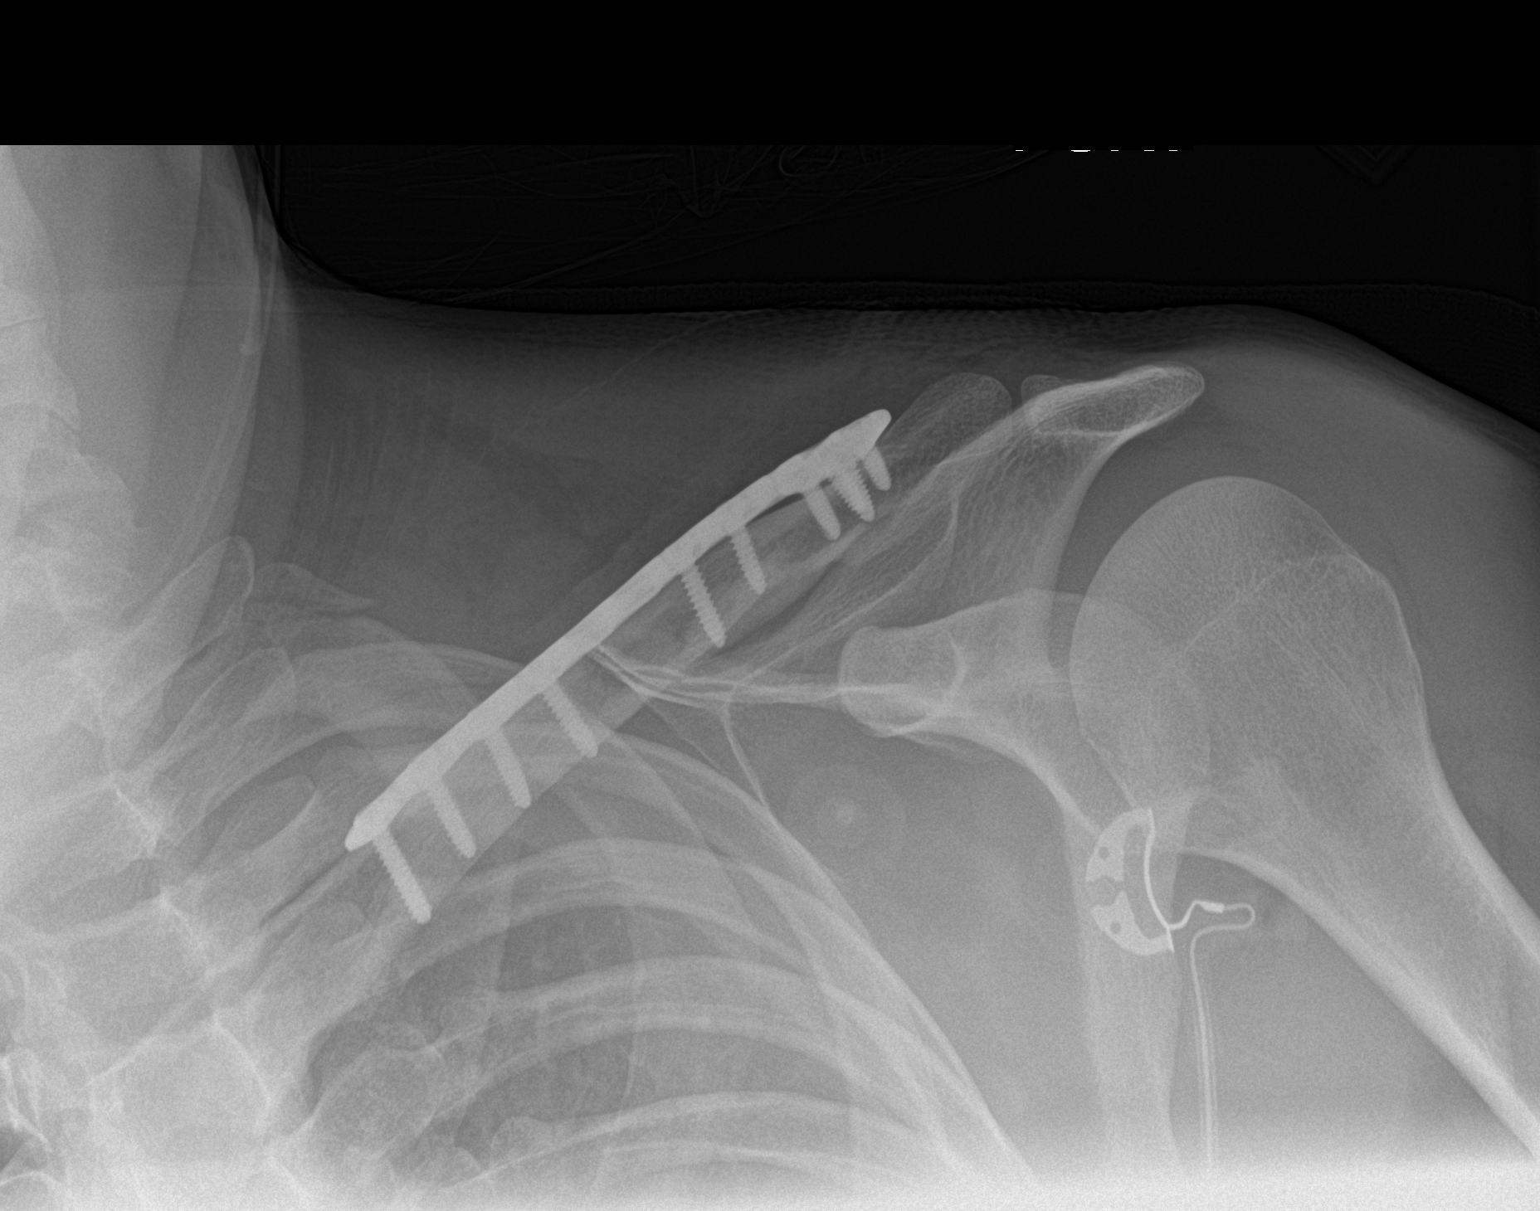

[3 of 3 positions shown; findings below may reference images not displayed]

FINDINGS: Interval ORIF of a mid left clavicular fracture with side plate and
screw fixation construct. Fracture alignment improved, now near
anatomic. Alignment at the sternoclavicular and acromioclavicular
joints are maintained. Unchanged appearance of minimally displaced
posterior left first and second rib fractures.
IMPRESSION: 1. Interval ORIF of a mid left clavicular fracture with improved
alignment.
2. Minimally displaced posterior left first and second rib
fractures, unchanged.

## 2020-12-03 ENCOUNTER — Emergency Department (HOSPITAL_COMMUNITY)
Admission: EM | Admit: 2020-12-03 | Discharge: 2020-12-03 | Disposition: A | Discharge status: Home / Self Care | Payer: Medicaid Other | Attending: Emergency Medicine | Admitting: Emergency Medicine

## 2020-12-03 ENCOUNTER — Encounter (HOSPITAL_COMMUNITY): Payer: Self-pay | Admitting: Emergency Medicine

## 2020-12-03 ENCOUNTER — Emergency Department (HOSPITAL_COMMUNITY): Payer: Medicaid Other

## 2020-12-03 ENCOUNTER — Other Ambulatory Visit: Payer: Self-pay

## 2020-12-03 DIAGNOSIS — S59911A Unspecified injury of right forearm, initial encounter: Secondary | ICD-10-CM | POA: Diagnosis present

## 2020-12-03 DIAGNOSIS — S80212A Abrasion, left knee, initial encounter: Secondary | ICD-10-CM | POA: Diagnosis not present

## 2020-12-03 DIAGNOSIS — S50812A Abrasion of left forearm, initial encounter: Secondary | ICD-10-CM | POA: Diagnosis not present

## 2020-12-03 DIAGNOSIS — S80211A Abrasion, right knee, initial encounter: Secondary | ICD-10-CM | POA: Insufficient documentation

## 2020-12-03 DIAGNOSIS — R61 Generalized hyperhidrosis: Secondary | ICD-10-CM | POA: Diagnosis not present

## 2020-12-03 DIAGNOSIS — R519 Headache, unspecified: Secondary | ICD-10-CM | POA: Insufficient documentation

## 2020-12-03 DIAGNOSIS — R000 Tachycardia, unspecified: Secondary | ICD-10-CM | POA: Diagnosis not present

## 2020-12-03 DIAGNOSIS — M545 Low back pain, unspecified: Secondary | ICD-10-CM | POA: Insufficient documentation

## 2020-12-03 DIAGNOSIS — F17210 Nicotine dependence, cigarettes, uncomplicated: Secondary | ICD-10-CM | POA: Insufficient documentation

## 2020-12-03 DIAGNOSIS — S50811A Abrasion of right forearm, initial encounter: Secondary | ICD-10-CM | POA: Insufficient documentation

## 2020-12-03 DIAGNOSIS — M542 Cervicalgia: Secondary | ICD-10-CM | POA: Insufficient documentation

## 2020-12-03 MED ORDER — HYDROCODONE-ACETAMINOPHEN 5-325 MG PO TABS: 2.0000 | ORAL_TABLET | Freq: Four times a day (QID) | ORAL | 0 refills | Status: DC | PRN

## 2020-12-03 MED ORDER — ONDANSETRON HCL 4 MG/2ML IJ SOLN
4.0000 mg | Freq: Once | INTRAMUSCULAR | Status: AC
  Administered 2020-12-03: 4 mg via INTRAVENOUS
  Filled 2020-12-03: qty 2

## 2020-12-03 MED ORDER — CEPHALEXIN 500 MG PO CAPS: 500.0000 mg | ORAL_CAPSULE | Freq: Two times a day (BID) | ORAL | 0 refills | Status: AC

## 2020-12-03 MED ORDER — LIDOCAINE HCL URETHRAL/MUCOSAL 2 % EX GEL: 1.0000 "application " | Freq: Once | CUTANEOUS | Status: DC

## 2020-12-03 MED ORDER — BACITRACIN ZINC 500 UNIT/GM EX OINT
TOPICAL_OINTMENT | Freq: Two times a day (BID) | CUTANEOUS | Status: DC
  Filled 2020-12-03: qty 2.7
  Filled 2020-12-03: qty 1.8
  Filled 2020-12-03: qty 2.7

## 2020-12-03 MED ORDER — CEFAZOLIN SODIUM-DEXTROSE 1-4 GM/50ML-% IV SOLN
1.0000 g | Freq: Once | INTRAVENOUS | Status: AC
  Administered 2020-12-03: 1 g via INTRAVENOUS
  Filled 2020-12-03: qty 50

## 2020-12-03 MED ORDER — NICOTINE 14 MG/24HR TD PT24
14.0000 mg | MEDICATED_PATCH | Freq: Once | TRANSDERMAL | Status: DC
  Administered 2020-12-03: 14 mg via TRANSDERMAL
  Filled 2020-12-03: qty 1

## 2020-12-03 MED ORDER — MORPHINE SULFATE (PF) 4 MG/ML IV SOLN
8.0000 mg | INTRAVENOUS | Status: DC | PRN
Start: 2020-12-03 — End: 2020-12-03
  Administered 2020-12-03 (×3): 8 mg via INTRAVENOUS
  Filled 2020-12-03 (×3): qty 2

## 2020-12-03 MED ORDER — MORPHINE SULFATE (PF) 4 MG/ML IV SOLN
8.0000 mg | Freq: Once | INTRAVENOUS | Status: AC
Start: 2020-12-03 — End: 2020-12-03

## 2020-12-03 MED ORDER — MORPHINE SULFATE (PF) 4 MG/ML IV SOLN
INTRAVENOUS | Status: AC
  Administered 2020-12-03: 8 mg via INTRAVENOUS
  Filled 2020-12-03: qty 2

## 2020-12-03 NOTE — ED Notes (Signed)
Pt verbalized understanding of discharge paperwork and prescriptions. Pt informed of PCP options and follow-up care. All wounds cleaned and dressed.
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
## 2020-12-04 ENCOUNTER — Telehealth: Payer: Self-pay | Admitting: Nurse Practitioner

## 2020-12-04 NOTE — Telephone Encounter (Signed)
Pt was called VM was left for Pt to call to follow up on ED Visit

## 2021-01-01 ENCOUNTER — Other Ambulatory Visit: Payer: Self-pay

## 2021-01-01 ENCOUNTER — Ambulatory Visit (INDEPENDENT_AMBULATORY_CARE_PROVIDER_SITE_OTHER): Payer: Medicaid Other | Admitting: Primary Care

## 2021-01-01 ENCOUNTER — Encounter (INDEPENDENT_AMBULATORY_CARE_PROVIDER_SITE_OTHER): Payer: Self-pay | Admitting: Primary Care

## 2021-01-01 VITALS — BP 121/81 | HR 92 | Temp 98.1°F | Ht 71.0 in | Wt 178.0 lb

## 2021-01-01 DIAGNOSIS — Z09 Encounter for follow-up examination after completed treatment for conditions other than malignant neoplasm: Secondary | ICD-10-CM

## 2021-01-01 DIAGNOSIS — Z7689 Persons encountering health services in other specified circumstances: Secondary | ICD-10-CM

## 2021-01-01 DIAGNOSIS — M549 Dorsalgia, unspecified: Secondary | ICD-10-CM

## 2021-01-01 DIAGNOSIS — M25561 Pain in right knee: Secondary | ICD-10-CM | POA: Diagnosis not present

## 2021-01-01 DIAGNOSIS — M542 Cervicalgia: Secondary | ICD-10-CM | POA: Diagnosis not present
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
## 2021-01-05 ENCOUNTER — Other Ambulatory Visit: Payer: Self-pay

## 2021-01-05 ENCOUNTER — Ambulatory Visit: Payer: Medicaid Other | Attending: Primary Care

## 2021-01-05 DIAGNOSIS — M545 Low back pain, unspecified: Secondary | ICD-10-CM | POA: Diagnosis present

## 2021-01-05 DIAGNOSIS — M25561 Pain in right knee: Secondary | ICD-10-CM | POA: Diagnosis not present

## 2021-01-05 DIAGNOSIS — M25661 Stiffness of right knee, not elsewhere classified: Secondary | ICD-10-CM | POA: Diagnosis present

## 2021-01-05 DIAGNOSIS — M542 Cervicalgia: Secondary | ICD-10-CM | POA: Insufficient documentation

## 2021-01-05 DIAGNOSIS — R262 Difficulty in walking, not elsewhere classified: Secondary | ICD-10-CM | POA: Insufficient documentation
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
## 2021-01-12 ENCOUNTER — Ambulatory Visit: Payer: Medicaid Other

## 2021-01-12 ENCOUNTER — Telehealth: Payer: Self-pay

## 2021-01-12 NOTE — Telephone Encounter (Signed)
Spoke with patient regarding missed PT appointment. He forgot about scheduled visit. Confirmed next scheduled appointment and reviewed attendance policy.

## 2021-01-14 ENCOUNTER — Ambulatory Visit: Payer: Medicaid Other

## 2021-01-14 ENCOUNTER — Other Ambulatory Visit: Payer: Self-pay

## 2021-01-14 DIAGNOSIS — M545 Low back pain, unspecified: Secondary | ICD-10-CM

## 2021-01-14 DIAGNOSIS — M25661 Stiffness of right knee, not elsewhere classified: Secondary | ICD-10-CM

## 2021-01-14 DIAGNOSIS — M25561 Pain in right knee: Secondary | ICD-10-CM

## 2021-01-14 DIAGNOSIS — R262 Difficulty in walking, not elsewhere classified: Secondary | ICD-10-CM

## 2021-01-14 DIAGNOSIS — M542 Cervicalgia: Secondary | ICD-10-CM
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
## 2021-02-04 ENCOUNTER — Ambulatory Visit: Payer: Medicaid Other | Attending: Primary Care

## 2021-02-04 ENCOUNTER — Telehealth: Payer: Self-pay

## 2021-02-04 DIAGNOSIS — M25661 Stiffness of right knee, not elsewhere classified: Secondary | ICD-10-CM | POA: Insufficient documentation

## 2021-02-04 DIAGNOSIS — R262 Difficulty in walking, not elsewhere classified: Secondary | ICD-10-CM | POA: Insufficient documentation

## 2021-02-04 DIAGNOSIS — M25561 Pain in right knee: Secondary | ICD-10-CM | POA: Insufficient documentation

## 2021-02-04 DIAGNOSIS — M545 Low back pain, unspecified: Secondary | ICD-10-CM | POA: Insufficient documentation

## 2021-02-04 DIAGNOSIS — M542 Cervicalgia: Secondary | ICD-10-CM | POA: Insufficient documentation

## 2021-02-04 NOTE — Telephone Encounter (Signed)
Left voicemail notifying patient of missed appointment. Reviewed attendance policy and reminded patient of next scheduled visit. Informed him that future appointments past next session well be cancelled due to attendance.

## 2021-02-11 ENCOUNTER — Other Ambulatory Visit: Payer: Self-pay

## 2021-02-11 ENCOUNTER — Ambulatory Visit: Payer: Medicaid Other

## 2021-02-11 DIAGNOSIS — M545 Low back pain, unspecified: Secondary | ICD-10-CM | POA: Diagnosis present

## 2021-02-11 DIAGNOSIS — R262 Difficulty in walking, not elsewhere classified: Secondary | ICD-10-CM

## 2021-02-11 DIAGNOSIS — M542 Cervicalgia: Secondary | ICD-10-CM

## 2021-02-11 DIAGNOSIS — M25661 Stiffness of right knee, not elsewhere classified: Secondary | ICD-10-CM | POA: Diagnosis present

## 2021-02-11 DIAGNOSIS — M25561 Pain in right knee: Secondary | ICD-10-CM | POA: Diagnosis present
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
## 2021-03-04 ENCOUNTER — Telehealth: Payer: Self-pay

## 2021-03-04 ENCOUNTER — Ambulatory Visit: Payer: Medicaid Other | Attending: Primary Care

## 2021-03-04 NOTE — Telephone Encounter (Signed)
Spoke with patient regarding missed PT appointment. Reviewed attendance policy and informed patient that since this was his third no show he will be discharged from therapy and will require a new referral to begin PT. He was encouraged to f/u with the referring provider as he reports his back pain is worsening with patient verbalizing understanding.

## 2022-12-06 ENCOUNTER — Emergency Department (HOSPITAL_BASED_OUTPATIENT_CLINIC_OR_DEPARTMENT_OTHER)
Admission: EM | Admit: 2022-12-06 | Discharge: 2022-12-06 | Disposition: A | Discharge status: Home / Self Care | Payer: Medicaid Other | Attending: Emergency Medicine | Admitting: Emergency Medicine

## 2022-12-06 ENCOUNTER — Other Ambulatory Visit: Payer: Self-pay

## 2022-12-06 ENCOUNTER — Emergency Department (HOSPITAL_BASED_OUTPATIENT_CLINIC_OR_DEPARTMENT_OTHER): Payer: Medicaid Other

## 2022-12-06 DIAGNOSIS — W2201XA Walked into wall, initial encounter: Secondary | ICD-10-CM | POA: Insufficient documentation

## 2022-12-06 DIAGNOSIS — S6991XA Unspecified injury of right wrist, hand and finger(s), initial encounter: Secondary | ICD-10-CM | POA: Diagnosis present

## 2022-12-06 DIAGNOSIS — F17210 Nicotine dependence, cigarettes, uncomplicated: Secondary | ICD-10-CM | POA: Diagnosis not present

## 2022-12-06 DIAGNOSIS — S62332A Displaced fracture of neck of third metacarpal bone, right hand, initial encounter for closed fracture: Secondary | ICD-10-CM | POA: Insufficient documentation

## 2022-12-06 MED ORDER — NAPROXEN 250 MG PO TABS
500.0000 mg | ORAL_TABLET | Freq: Once | ORAL | Status: AC
  Administered 2022-12-06: 500 mg via ORAL
  Filled 2022-12-06: qty 2

## 2022-12-06 MED ORDER — NAPROXEN 375 MG PO TABS: ORAL_TABLET | ORAL | 0 refills | Status: DC

## 2022-12-06 MED ORDER — HYDROCODONE-ACETAMINOPHEN 5-325 MG PO TABS: 1.0000 | ORAL_TABLET | Freq: Four times a day (QID) | ORAL | 0 refills | Status: DC | PRN
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
## 2022-12-06 NOTE — ED Triage Notes (Signed)
Pt arrives with c/o right hand pain after punching a wall. Pts radial pulse is +3 and pt can wiggle his fingers.

## 2022-12-27 ENCOUNTER — Encounter (HOSPITAL_COMMUNITY): Payer: Self-pay | Admitting: Orthopedic Surgery

## 2022-12-27 ENCOUNTER — Other Ambulatory Visit: Payer: Self-pay

## 2022-12-27 NOTE — Progress Notes (Signed)
PCP - Denies  EKG - DOS  Anesthesia review: N  Patient verbally denies any shortness of breath, fever, cough and chest pain during phone call   -------------  SDW INSTRUCTIONS given:  Your procedure is scheduled on Wednesday, July 10th.  Report to [facility] Main Entrance "A" at 1445 A.M., and check in at the Admitting office.  Call this number if you have problems the morning of surgery:  [phone]   Remember:  Do not eat after midnight the night before your surgery  You may drink clear liquids until 1400 the afternoon of your surgery.   Clear liquids allowed are: Water, Non-Citrus Juices (without pulp), Carbonated Beverages, Clear Tea, Black Coffee Only, and Gatorade    Take these medicines the morning of surgery with A SIP OF WATER  TOPROL  fluticasone (FLONASE)-if needed HYDROcodone-acetaminophen (NORCO)-if needed   As of today, STOP taking any Aspirin (unless otherwise instructed by your surgeon) Aleve, Naproxen, Ibuprofen, Motrin, Advil, Goody's, BC's, all herbal medications, fish oil, and all vitamins.                      Do not wear jewelry, make up, or nail polish            Do not wear lotions, powders, perfumes/colognes, or deodorant.            Do not shave 48 hours prior to surgery.  Men may shave face and neck.            Do not bring valuables to the hospital.            [facility] is not [facility] for any belongings or valuables.  Do NOT Smoke (Tobacco/Vaping) 24 hours prior to your procedure If you use a CPAP at night, you may bring all equipment for your overnight stay.   Contacts, glasses, dentures or bridgework may not be worn into surgery.      For patients admitted to the hospital, discharge time will be determined by your treatment team.   Patients discharged the day of surgery will not be allowed to drive home, and someone needs to stay with them for 24 hours.    Special instructions:   [place]- Preparing For Surgery  Before  surgery, you can play an important role. Because skin is not sterile, your skin needs to be as free of germs as possible. You can reduce the number of germs on your skin by washing with CHG (chlorahexidine gluconate) Soap before surgery.  CHG is an antiseptic cleaner which kills germs and bonds with the skin to continue killing germs even after washing.    Oral Hygiene is also important to reduce your risk of infection.  Remember - BRUSH YOUR TEETH THE MORNING OF SURGERY WITH YOUR REGULAR TOOTHPASTE  Please do not use if you have an allergy to CHG or antibacterial soaps. If your skin becomes reddened/irritated stop using the CHG.  Do not shave (including legs and underarms) for at least 48 hours prior to first CHG shower. It is OK to shave your face.  Please follow these instructions carefully.   Shower the NIGHT BEFORE SURGERY and the MORNING OF SURGERY with DIAL Soap.   Pat yourself dry with a CLEAN TOWEL.  Wear CLEAN PAJAMAS to bed the night before surgery  Place CLEAN SHEETS on your bed the night of your first shower and DO NOT SLEEP WITH PETS.   Day of Surgery: Please shower morning of surgery  Wear Clean/Comfortable clothing the morning of surgery Do not apply any deodorants/lotions.   Remember to brush your teeth WITH YOUR REGULAR TOOTHPASTE.   Questions were answered. Patient verbalized understanding of instructions.

## 2022-12-29 ENCOUNTER — Encounter (HOSPITAL_COMMUNITY)
Admission: RE | Disposition: A | Discharge status: Home / Self Care | Payer: Self-pay | Source: Home / Self Care | Attending: Orthopedic Surgery

## 2022-12-29 ENCOUNTER — Other Ambulatory Visit: Payer: Self-pay

## 2022-12-29 ENCOUNTER — Ambulatory Visit (HOSPITAL_COMMUNITY)
Admission: RE | Admit: 2022-12-29 | Discharge: 2022-12-29 | Disposition: A | Discharge status: Home / Self Care | Payer: Medicaid Other | Attending: Orthopedic Surgery | Admitting: Orthopedic Surgery

## 2022-12-29 ENCOUNTER — Ambulatory Visit (HOSPITAL_COMMUNITY): Payer: Medicaid Other

## 2022-12-29 ENCOUNTER — Ambulatory Visit (HOSPITAL_BASED_OUTPATIENT_CLINIC_OR_DEPARTMENT_OTHER): Payer: Medicaid Other | Admitting: Anesthesiology

## 2022-12-29 ENCOUNTER — Ambulatory Visit (HOSPITAL_COMMUNITY): Payer: Medicaid Other | Admitting: Anesthesiology

## 2022-12-29 ENCOUNTER — Encounter (HOSPITAL_COMMUNITY): Payer: Self-pay | Admitting: Orthopedic Surgery

## 2022-12-29 DIAGNOSIS — F32A Depression, unspecified: Secondary | ICD-10-CM | POA: Diagnosis not present

## 2022-12-29 DIAGNOSIS — S62251A Displaced fracture of neck of first metacarpal bone, right hand, initial encounter for closed fracture: Secondary | ICD-10-CM | POA: Insufficient documentation

## 2022-12-29 DIAGNOSIS — X58XXXA Exposure to other specified factors, initial encounter: Secondary | ICD-10-CM | POA: Diagnosis not present

## 2022-12-29 DIAGNOSIS — F419 Anxiety disorder, unspecified: Secondary | ICD-10-CM | POA: Diagnosis not present

## 2022-12-29 DIAGNOSIS — S62332A Displaced fracture of neck of third metacarpal bone, right hand, initial encounter for closed fracture: Secondary | ICD-10-CM | POA: Diagnosis not present

## 2022-12-29 HISTORY — DX: Anxiety disorder, unspecified: F41.9

## 2022-12-29 HISTORY — DX: Post-traumatic stress disorder, unspecified: F43.10

## 2022-12-29 HISTORY — DX: Gastro-esophageal reflux disease without esophagitis: K21.9

## 2022-12-29 HISTORY — PX: OPEN REDUCTION INTERNAL FIXATION (ORIF) METACARPAL: SHX6234

## 2022-12-29 HISTORY — DX: Depression, unspecified: F32.A

## 2022-12-29 SURGERY — OPEN REDUCTION INTERNAL FIXATION (ORIF) METACARPAL
Anesthesia: General | Site: Finger | Laterality: Right

## 2022-12-29 MED ORDER — LACTATED RINGERS IV SOLN: INTRAVENOUS | Status: DC

## 2022-12-29 MED ORDER — MIDAZOLAM HCL 2 MG/2ML IJ SOLN
INTRAMUSCULAR | Status: AC
  Filled 2022-12-29: qty 2

## 2022-12-29 MED ORDER — ONDANSETRON HCL 4 MG/2ML IJ SOLN: 4.0000 mg | Freq: Once | INTRAMUSCULAR | Status: DC | PRN

## 2022-12-29 MED ORDER — BUPIVACAINE HCL (PF) 0.25 % IJ SOLN
INTRAMUSCULAR | Status: DC | PRN
  Administered 2022-12-29: 20 mL

## 2022-12-29 MED ORDER — KETOROLAC TROMETHAMINE 30 MG/ML IJ SOLN
INTRAMUSCULAR | Status: AC
  Filled 2022-12-29: qty 1

## 2022-12-29 MED ORDER — DEXAMETHASONE SODIUM PHOSPHATE 10 MG/ML IJ SOLN
INTRAMUSCULAR | Status: AC
  Filled 2022-12-29: qty 1

## 2022-12-29 MED ORDER — FENTANYL CITRATE (PF) 100 MCG/2ML IJ SOLN: 25.0000 ug | INTRAMUSCULAR | Status: DC | PRN

## 2022-12-29 MED ORDER — KETOROLAC TROMETHAMINE 30 MG/ML IJ SOLN
INTRAMUSCULAR | Status: DC | PRN
  Administered 2022-12-29: 30 mg via INTRAVENOUS

## 2022-12-29 MED ORDER — FENTANYL CITRATE (PF) 250 MCG/5ML IJ SOLN
INTRAMUSCULAR | Status: DC | PRN
  Administered 2022-12-29: 50 ug via INTRAVENOUS
  Administered 2022-12-29: 25 ug via INTRAVENOUS

## 2022-12-29 MED ORDER — BUPIVACAINE HCL (PF) 0.25 % IJ SOLN
INTRAMUSCULAR | Status: AC
  Filled 2022-12-29: qty 20

## 2022-12-29 MED ORDER — CHLORHEXIDINE GLUCONATE 0.12 % MT SOLN: 15.0000 mL | OROMUCOSAL | Status: DC

## 2022-12-29 MED ORDER — ACETAMINOPHEN 10 MG/ML IV SOLN
INTRAVENOUS | Status: DC | PRN
  Administered 2022-12-29: 1000 mg via INTRAVENOUS

## 2022-12-29 MED ORDER — ONDANSETRON HCL 4 MG/2ML IJ SOLN
INTRAMUSCULAR | Status: DC | PRN
  Administered 2022-12-29: 4 mg via INTRAVENOUS

## 2022-12-29 MED ORDER — ONDANSETRON HCL 4 MG/2ML IJ SOLN
INTRAMUSCULAR | Status: AC
  Filled 2022-12-29: qty 2

## 2022-12-29 MED ORDER — DEXMEDETOMIDINE HCL IN NACL 80 MCG/20ML IV SOLN
INTRAVENOUS | Status: DC | PRN
  Administered 2022-12-29: 8 ug via INTRAVENOUS
  Administered 2022-12-29 (×2): 12 ug via INTRAVENOUS
  Administered 2022-12-29: 8 ug via INTRAVENOUS

## 2022-12-29 MED ORDER — AMISULPRIDE (ANTIEMETIC) 5 MG/2ML IV SOLN: 10.0000 mg | Freq: Once | INTRAVENOUS | Status: DC | PRN

## 2022-12-29 MED ORDER — MIDAZOLAM HCL 5 MG/5ML IJ SOLN
INTRAMUSCULAR | Status: DC | PRN
  Administered 2022-12-29 (×2): 2 mg via INTRAVENOUS

## 2022-12-29 MED ORDER — CEFAZOLIN SODIUM-DEXTROSE 2-4 GM/100ML-% IV SOLN
2.0000 g | INTRAVENOUS | Status: AC
  Administered 2022-12-29: 2 g via INTRAVENOUS
  Filled 2022-12-29: qty 100

## 2022-12-29 MED ORDER — DEXAMETHASONE SODIUM PHOSPHATE 10 MG/ML IJ SOLN
INTRAMUSCULAR | Status: DC | PRN
  Administered 2022-12-29: 10 mg via INTRAVENOUS

## 2022-12-29 MED ORDER — CHLORHEXIDINE GLUCONATE 0.12 % MT SOLN
OROMUCOSAL | Status: AC
  Administered 2022-12-29: 15 mL
  Filled 2022-12-29: qty 15

## 2022-12-29 MED ORDER — ACETAMINOPHEN 10 MG/ML IV SOLN
INTRAVENOUS | Status: AC
  Filled 2022-12-29: qty 100

## 2022-12-29 MED ORDER — 0.9 % SODIUM CHLORIDE (POUR BTL) OPTIME
TOPICAL | Status: DC | PRN
  Administered 2022-12-29: 1000 mL

## 2022-12-29 MED ORDER — OXYCODONE HCL 5 MG PO TABS: 5.0000 mg | ORAL_TABLET | Freq: Four times a day (QID) | ORAL | 0 refills | Status: AC | PRN

## 2022-12-29 MED ORDER — DEXMEDETOMIDINE HCL IN NACL 80 MCG/20ML IV SOLN
INTRAVENOUS | Status: AC
  Filled 2022-12-29: qty 20

## 2022-12-29 MED ORDER — LIDOCAINE 2% (20 MG/ML) 5 ML SYRINGE
INTRAMUSCULAR | Status: DC | PRN
  Administered 2022-12-29: 80 mg via INTRAVENOUS

## 2022-12-29 MED ORDER — PROPOFOL 10 MG/ML IV BOLUS
INTRAVENOUS | Status: DC | PRN
  Administered 2022-12-29: 100 mg via INTRAVENOUS
  Administered 2022-12-29: 200 mg via INTRAVENOUS

## 2022-12-29 MED ORDER — FENTANYL CITRATE (PF) 250 MCG/5ML IJ SOLN
INTRAMUSCULAR | Status: AC
  Filled 2022-12-29: qty 5

## 2022-12-29 SURGICAL SUPPLY — 47 items
BAG COUNTER SPONGE SURGICOUNT (BAG) ×2 IMPLANT
BAG SPNG CNTER NS LX DISP (BAG) ×1
BIT DRILL 3.0X110 (BIT) IMPLANT
BNDG CMPR 5X3 KNIT ELC UNQ LF (GAUZE/BANDAGES/DRESSINGS) ×1
BNDG CMPR 9X4 STRL LF SNTH (GAUZE/BANDAGES/DRESSINGS) ×1
BNDG ELASTIC 3INX 5YD STR LF (GAUZE/BANDAGES/DRESSINGS) ×2 IMPLANT
BNDG ELASTIC 4X5.8 VLCR STR LF (GAUZE/BANDAGES/DRESSINGS) ×2 IMPLANT
BNDG ESMARK 4X9 LF (GAUZE/BANDAGES/DRESSINGS) ×2 IMPLANT
BNDG GAUZE DERMACEA FLUFF 4 (GAUZE/BANDAGES/DRESSINGS) ×6 IMPLANT
BNDG GZE DERMACEA 4 6PLY (GAUZE/BANDAGES/DRESSINGS) ×3
CORD BIPOLAR FORCEPS 12FT (ELECTRODE) ×2 IMPLANT
COVER SURGICAL LIGHT HANDLE (MISCELLANEOUS) ×2 IMPLANT
CUFF TOURN SGL QUICK 18X4 (TOURNIQUET CUFF) ×2 IMPLANT
DRAPE SURG 17X23 STRL (DRAPES) ×2 IMPLANT
DRSG XEROFORM 1X8 (GAUZE/BANDAGES/DRESSINGS) IMPLANT
GAUZE SPONGE 4X4 12PLY STRL (GAUZE/BANDAGES/DRESSINGS) ×2 IMPLANT
GAUZE SPONGE 4X4 12PLY STRL LF (GAUZE/BANDAGES/DRESSINGS) IMPLANT
GAUZE XEROFORM 1X8 LF (GAUZE/BANDAGES/DRESSINGS) ×2 IMPLANT
GLOVE BIOGEL M 8.0 STRL (GLOVE) ×2 IMPLANT
GLOVE SS BIOGEL STRL SZ 8 (GLOVE) ×2 IMPLANT
GOWN STRL REUS W/ TWL LRG LVL3 (GOWN DISPOSABLE) ×2 IMPLANT
GOWN STRL REUS W/ TWL XL LVL3 (GOWN DISPOSABLE) ×2 IMPLANT
GOWN STRL REUS W/TWL LRG LVL3 (GOWN DISPOSABLE) ×1
GOWN STRL REUS W/TWL XL LVL3 (GOWN DISPOSABLE) ×1
K-WIRE FIXATION TRI 1.1X120 (WIRE) ×1
KIT BASIN OR (CUSTOM PROCEDURE TRAY) ×2 IMPLANT
KIT TURNOVER KIT B (KITS) ×2 IMPLANT
KWIRE FIXATION TRI 1.1X120 (WIRE) IMPLANT
MANIFOLD NEPTUNE II (INSTRUMENTS) ×2 IMPLANT
NAIL IM THRD 3X50 (Nail) IMPLANT
NS IRRIG 1000ML POUR BTL (IV SOLUTION) ×2 IMPLANT
PACK ORTHO EXTREMITY (CUSTOM PROCEDURE TRAY) ×2 IMPLANT
PAD ARMBOARD 7.5X6 YLW CONV (MISCELLANEOUS) ×4 IMPLANT
PAD CAST 3X4 CTTN HI CHSV (CAST SUPPLIES) ×2 IMPLANT
PAD CAST 4YDX4 CTTN HI CHSV (CAST SUPPLIES) ×2 IMPLANT
PADDING CAST COTTON 2X4 NS (CAST SUPPLIES) IMPLANT
PADDING CAST COTTON 3X4 STRL (CAST SUPPLIES) ×1
PADDING CAST COTTON 4X4 STRL (CAST SUPPLIES) ×1
SOL PREP POV-IOD 4OZ 10% (MISCELLANEOUS) ×4 IMPLANT
SPLINT FIBERGLASS 4X30 (CAST SUPPLIES) IMPLANT
SUT MNCRL AB 4-0 PS2 18 (SUTURE) ×2 IMPLANT
SUT VICRYL RAPIDE 4/0 PS 2 (SUTURE) IMPLANT
TOWEL GREEN STERILE (TOWEL DISPOSABLE) ×2 IMPLANT
TOWEL GREEN STERILE FF (TOWEL DISPOSABLE) ×2 IMPLANT
TUBE CONNECTING 12X1/4 (SUCTIONS) ×2 IMPLANT
TUBE EVACUATION TLS (MISCELLANEOUS) ×2 IMPLANT
WATER STERILE IRR 1000ML POUR (IV SOLUTION) ×2 IMPLANT

## 2022-12-29 NOTE — H&P (Signed)
HAND SURGERY   HPI: Patient is a 37 y.o. male who presents with with a closed, right third metacarpal neck fracture.  He has approximately 45-60 degrees of apex dorsal angulation with extensor lag.  Patient is anxious to return to work ASAP and has elected for intramedullary screw fixation of this fracture to allow early AROM with therapy.  Patient denies any changes to their medical history or new systemic symptoms today.    Past Medical History:  Diagnosis Date   Anxiety    Depression    GERD (gastroesophageal reflux disease)    Marijuana use    Nicotine dependence    PTSD (post-traumatic stress disorder)    Past Surgical History:  Procedure Laterality Date   HAND SURGERY     ORIF CLAVICULAR FRACTURE Left 06/04/2019   Procedure: OPEN REDUCTION INTERNAL FIXATION LEFT CLAVICULAR FRACTURE;  Surgeon: Myrene Galas, MD;  Location: [facility];  Service: Orthopedics;  Laterality: Left;   WISDOM TOOTH EXTRACTION     Social History   Socioeconomic History   Marital status: Legally Separated    Spouse name: Not on file   Number of children: Not on file   Years of education: Not on file   Highest education level: Not on file  Occupational History   Not on file  Tobacco Use   Smoking status: Every Day    Packs/day: 2    Types: Cigarettes   Smokeless tobacco: Never  Vaping Use   Vaping Use: Never used  Substance and Sexual Activity   Alcohol use: No   Drug use: Yes    Frequency: 28.0 times per week    Types: Marijuana    Comment: 2   Sexual activity: Not on file  Other Topics Concern   Not on file  Social History Narrative   ** Merged History Encounter **       Social Determinants of Health   Financial Resource Strain: Not on file  Food Insecurity: Not on file  Transportation Needs: Not on file  Physical Activity: Not on file  Stress: Not on file  Social Connections: Not on file   Family History  Problem Relation Age of Onset   Coronary artery disease Other     Cancer Other    - negative except otherwise stated in the family history section Allergies  Allergen Reactions   Peanut-Containing Drug Products Shortness Of Breath and Nausea And Vomiting   Peanut-Containing Drug Products Anaphylaxis and Nausea And Vomiting   Prior to Admission medications   Medication Sig Start Date End Date Taking? Authorizing Provider  fluticasone (FLONASE) 50 MCG/ACT nasal spray Place 2 sprays into the nose daily as needed for allergies.   Yes [provider]  HYDROcodone-acetaminophen (NORCO) 5-325 MG tablet Take 1 tablet by mouth every 6 (six) hours as needed for severe pain. 12/06/22  Yes Molpus, John, MD  naproxen (NAPROSYN) 375 MG tablet Take 1 tablet twice daily as needed for pain. 12/06/22  Yes Molpus, John, MD  Soft Lens Products (REWETTING DROPS) SOLN Place 1 drop into both eyes 3 (three) times daily as needed (contact/dry irritation).   Yes [provider]  TOPROL XL 50 MG 24 hr tablet Take 50 mg by mouth in the morning. 12/22/22  Yes [provider]  ZOLOFT 50 MG tablet Take 50 mg by mouth in the morning. 12/22/22   [provider]   No results found. - Positive ROS: All other systems have been reviewed and were otherwise negative with the  exception of those mentioned in the HPI and as above.  Physical Exam: General: No acute distress, resting comfortably Cardiovascular: BUE warm and well perfused, normal rate Respiratory: Normal WOB on RA Skin: Warm and dry Neurologic: Sensation intact distally Psychiatric: Patient is at baseline mood and affect  Right Upper Extremity  TTP over the third metacarpal neck with mild swelling.  No open wounds.  Notable extensor lag of the middle finger with limited active flexion secondary to swelling and pain.  Full and painless AROM of the wrist and remaining fingers.  SILT m/u/r distribution.  Hand warm and well perfused w/ BCR.    Assessment: 37 yo M w/ closed, right third metacarpal  neck fracture with approx 45-60 degrees of apex dorsal angulation with associated extensor lag.  Presents today for fracture fixation with intramedullary screw.   Plan: OR today for closed versus open reduction and insertion of intramedullary screw. We again reviewed the risks of surgery which include bleeding, infection, damage to neurovascular structures, persistent symptoms, nonunion, malunion, stiffness, persistent extensor lag, malrotation, need for additional surgery.  Informed consent was signed.  All questions were answered.   Marlyne Beards, M.D. [facility] 4:10 PM

## 2022-12-29 NOTE — Op Note (Signed)
Date of Surgery: 12/29/2022  INDICATIONS: Patient is a 37 y.o.-year-old male with a closed, right third metacarpal neck fracture.  Radiographs in the office showed apex dorsal angulation of approximately 45-60 degrees with associated extensor lag.  Patient and I had a very thorough discussion regarding both operative and non operative treatment include the risks and benefits of each treatment approach.  After our discussion, patient elected to proceed with surgical management of the fracture.  Risks, benefits, and alternatives to surgery were again discussed with the patient in the preoperative area. The patient wishes to proceed with surgery.  Informed consent was signed after our discussion.   PREOPERATIVE DIAGNOSIS:  Closed, right third metacarpal neck fracture  POSTOPERATIVE DIAGNOSIS: Same.  PROCEDURE:  ORIF right third metacarpal neck fracture ([address])   SURGEON: Waylan Rocher, M.D.  ASSIST:   ANESTHESIA:  general, local   IV FLUIDS AND URINE: See anesthesia.  ESTIMATED BLOOD LOSS: <5 mL.  IMPLANTS:  Implant Name Type Inv. Item Serial No. Manufacturer Lot No. LRB No. Used Action  2.77mm x 50mm  Threaded IM Nail     [id number] Right 1 Implanted    ** Threaded IM nail was 3.0 mm in diameter  DRAINS: None  COMPLICATIONS: None  DESCRIPTION OF PROCEDURE: The patient was met in the preoperative holding area where the surgical site was marked and the consent form was signed.  The patient was then taken to the operating room and transferred to the operating table.  All bony prominences were well padded.  A tourniquet was applied to the right upper arm.  General endotracheal anesthesia was induced.  The operative extremity was prepped and draped in the usual and sterile fashion.  A formal time-out was performed to confirm that this was the correct patient, surgery, side, and site.   Following formal timeout, an attempt was made at closed reduction to correct the apex dorsal  angulation of the metacarpal neck.  This was unsuccessful.  I then used the mini fluoroscopy machine to localize the fracture site.  The limb was exsanguinated with an Esmarch bandage and the tourniquet inflated to 250 mmHg.  I then made a transverse incision at the level of the fracture.  The skin was incised.  Blunt dissection was used to identify the extensor tendon.  The extensor tendon was retracted radially.  I then identified the periosteum overlying the metacarpal neck.  The periosteum was incised longitudinally.  The fracture site was identified.  A Freer elevator was used to manipulate the fracture and correct the apex dorsal angulation.  The guidewire was then driven in a retrograde fashion down the center of the metacarpal starting in the dorsal third of the metacarpal head.  The starting point of the wire was at the ulnar aspect of the extensor tendon.  A small incision was made along the wire to ensure that the wire was not going through the extensor tendon itself.  AP, lateral, and oblique views demonstrated appropriate position of the wire.  The measured length was approximately 55 mm.  I therefore chose a 50 mm screw to allow the screw to be buried in the subchondral bone.  The guidewire was then driven across the [facility] joint.  The appropriate reamer was then used to prepare the path for the screw.  A 3.0 x 50 mm small threaded intramedullary nail was then placed by hand.  The fingers were held in full flexion to ensure appropriate rotation through the fracture.  AP, lateral, and oblique views  show that the screw was in appropriate position and was buried beneath the subchondral bone of the metacarpal head.  The tourniquet was deflated.  The guidewire was removed.  Tenodesis demonstrated that the fingers had appropriate digital cascade without clinical malrotation.  The wounds were then thoroughly irrigated with copious sterile saline.  They were closed using a 4-0 Vicryl Rapide suture in horizontal  mattress fashion.  The wounds were dressed with Xeroform, 4 x 4's, cast padding, and a well-padded volar P1 blocking splint was applied to allow for active range of motion of the PIP joints.  The patient was reversed from anesthesia and extubated uneventfully.  They were transferred from the operating table to the postoperative bed.  All counts were correct x 2 at the end of the procedure.  The patient was then taken to the PACU in stable condition.   POSTOPERATIVE PLAN: He will be discharged home with appropriate pain medication and discharge instructions.  A referral has been placed to hand therapy.  He will hopefully start within 1 week.  He can begin early active range of motion.  I will see him back in 10 to 14 days for his first postop visit.  Waylan Rocher, MD 5:48 PM

## 2022-12-29 NOTE — Interval H&P Note (Signed)
History and Physical Interval Note:  12/29/2022 4:12 PM  Chad [name]  has presented today for surgery, with the diagnosis of Right third metacarpal neck fracture.  The various methods of treatment have been discussed with the patient and family. After consideration of risks, benefits and other options for treatment, the patient has consented to  Procedure(s) with comments: OPEN REDUCTION INTERNAL FIXATION (ORIF) METACARPAL NECK fracture (Right) - regional  75 as a surgical intervention.  The patient's history has been reviewed, patient examined, no change in status, stable for surgery.  I have reviewed the patient's chart and labs.  Questions were answered to the patient's satisfaction.     Raliegh Scobie Burnette Sautter

## 2022-12-29 NOTE — Anesthesia Procedure Notes (Signed)
Procedure Name: LMA Insertion Date/Time: 12/29/2022 4:23 PM  Performed by: Samara Deist, CRNAPre-anesthesia Checklist: Patient identified, Emergency Drugs available, Suction available and Patient being monitored Patient Re-evaluated:Patient Re-evaluated prior to induction Oxygen Delivery Method: Circle System Utilized Preoxygenation: Pre-oxygenation with 100% oxygen Induction Type: IV induction Ventilation: Mask ventilation without difficulty LMA: LMA inserted LMA Size: 5.0 Number of attempts: 1 Airway Equipment and Method: Bite block Placement Confirmation: positive ETCO2 Tube secured with: Tape Dental Injury: Teeth and Oropharynx as per pre-operative assessment

## 2022-12-29 NOTE — Transfer of Care (Signed)
Immediate Anesthesia Transfer of Care Note  Patient: Chad [name]  Procedure(s) Performed: OPEN REDUCTION INTERNAL FIXATION (ORIF) METACARPAL NECK fracture (Right: Finger)  Patient Location: PACU  Anesthesia Type:General  Level of Consciousness: drowsy  Airway & Oxygen Therapy: Patient Spontanous Breathing  Post-op Assessment: Report given to RN and Post -op Vital signs reviewed and stable  Post vital signs: Reviewed and stable  Last Vitals:  Vitals Value Taken Time  BP 98/62 12/29/22 1748  Temp    Pulse 62 12/29/22 1754  Resp 16 12/29/22 1754  SpO2 94 % 12/29/22 1754  Vitals shown include unvalidated device data.  Last Pain:  Vitals:   12/29/22 1451  TempSrc: Oral         Complications: No notable events documented.

## 2022-12-29 NOTE — Anesthesia Preprocedure Evaluation (Signed)
Anesthesia Evaluation  Patient identified by MRN, date of birth, ID band Patient awake    Reviewed: Allergy & Precautions, NPO status , Patient's Chart, lab work & pertinent test results, reviewed documented beta blocker date and time   Airway Mallampati: I  TM Distance: >3 FB Neck ROM: Full    Dental  (+) Dental Advisory Given, Missing   Pulmonary Current SmokerPatient did not abstain from smoking.   Pulmonary exam normal breath sounds clear to auscultation       Cardiovascular negative cardio ROS Normal cardiovascular exam Rhythm:Regular Rate:Normal     Neuro/Psych  PSYCHIATRIC DISORDERS Anxiety Depression    negative neurological ROS     GI/Hepatic Neg liver ROS,GERD  ,,  Endo/Other  negative endocrine ROS    Renal/GU negative Renal ROS     Musculoskeletal Right third metacarpal neck fracture   Abdominal   Peds  Hematology negative hematology ROS (+)   Anesthesia Other Findings Day of surgery medications reviewed with the patient.  Reproductive/Obstetrics                             Anesthesia Physical Anesthesia Plan  ASA: 2  Anesthesia Plan: General   Post-op Pain Management: Ofirmev IV (intra-op)* and Toradol IV (intra-op)*   Induction: Intravenous  PONV Risk Score and Plan: 1 and Midazolam, Dexamethasone and Ondansetron  Airway Management Planned: LMA  Additional Equipment:   Intra-op Plan:   Post-operative Plan: Extubation in OR  Informed Consent: I have reviewed the patients History and Physical, chart, labs and discussed the procedure including the risks, benefits and alternatives for the proposed anesthesia with the patient or authorized representative who has indicated his/her understanding and acceptance.     Dental advisory given  Plan Discussed with: CRNA  Anesthesia Plan Comments:        Anesthesia Quick Evaluation

## 2022-12-29 NOTE — Discharge Instructions (Signed)
Chad [name], M.D. Hand Surgery  POST-OPERATIVE DISCHARGE INSTRUCTIONS   PRESCRIPTIONS: You may have been given a prescription to be taken as directed for post-operative pain control.  You may also take over the counter ibuprofen/aleve and tylenol for pain. Take this as directed on the packaging. Do not exceed 3000 mg tylenol/acetaminophen in 24 hours.  Ibuprofen 600-800 mg (3-4) tablets by mouth every 6 hours as needed for pain.  OR Aleve 2 tablets by mouth every 12 hours (twice daily) as needed for pain.  AND/OR Tylenol 1000 mg (2 tablets) every 8 hours as needed for pain.  Please use your pain medication carefully, as refills are limited and you may not be provided with one.  As stated above, please use over the counter pain medicine - it will also be helpful with decreasing your swelling.    ANESTHESIA: After your surgery, post-surgical discomfort or pain is likely. This discomfort can last several days to a few weeks. At certain times of the day your discomfort may be more intense.   Did you receive a nerve block?  A nerve block can provide pain relief for one hour to two days after your surgery. As long as the nerve block is working, you will experience little or no sensation in the area the surgeon operated on.  As the nerve block wears off, you will begin to experience pain or discomfort. It is very important that you begin taking your prescribed pain medication before the nerve block fully wears off. Treating your pain at the first sign of the block wearing off will ensure your pain is better controlled and more tolerable when full-sensation returns. Do not wait until the pain is intolerable, as the medicine will be less effective. It is better to treat pain in advance than to try and catch up.   General Anesthesia:  If you did not receive a nerve block during your surgery, you will need to start taking your pain medication shortly after your surgery and should continue  to do so as prescribed by your surgeon.     ICE AND ELEVATION: You may use ice for the first 48-72 hours, but it is not critical.   Motion of your fingers is very important to decrease the swelling.  Elevation, as much as possible for the next 48 hours, is critical for decreasing swelling as well as for pain relief. Elevation means when you are seated or lying down, you hand should be at or above your heart. When walking, the hand needs to be at or above the level of your elbow.  If the bandage gets too tight, it may need to be loosened. Please contact our office and we will instruct you in how to do this.    SURGICAL BANDAGES:  Keep your dressing and/or splint clean and dry at all times.  Do not remove until you are seen again in the office.  If careful, you may place a plastic bag over your bandage and tape the end to shower, but be careful, do not get your bandages wet.     HAND THERAPY:  You will be contacted to schedule your first therapy appointment.  Therapy will be very important to regain full active motion of the fingers.    ACTIVITY AND WORK: You are encouraged to move any fingers which are not in the bandage.  Light use of the fingers is allowed to assist the other hand with daily hygiene and eating, but strong gripping or lifting  is often uncomfortable and should be avoided.  You might miss a variable period of time from work and hopefully this issue has been discussed prior to surgery. You may not do any heavy work with your affected hand for about 2 weeks.    [facility] Second Floor, [address] [place], [place] [address] [phone]

## 2022-12-30 NOTE — Anesthesia Postprocedure Evaluation (Signed)
Anesthesia Post Note  Patient: Chad [name]  Procedure(s) Performed: OPEN REDUCTION INTERNAL FIXATION (ORIF) METACARPAL NECK fracture (Right: Finger)     Patient location during evaluation: PACU Anesthesia Type: General Level of consciousness: awake and alert Pain management: pain level controlled Vital Signs Assessment: post-procedure vital signs reviewed and stable Respiratory status: spontaneous breathing, nonlabored ventilation, respiratory function stable and patient connected to nasal cannula oxygen Cardiovascular status: blood pressure returned to baseline and stable Postop Assessment: no apparent nausea or vomiting Anesthetic complications: no   No notable events documented.  Last Vitals:  Vitals:   12/29/22 1815 12/29/22 1830  BP: (!) 94/58 121/80  Pulse: 64 69  Resp: 16 16  Temp:  (!) 36.4 C  SpO2: 95% 97%    Last Pain:  Vitals:   12/29/22 1451  TempSrc: Oral                 Collene Schlichter

## 2022-12-31 ENCOUNTER — Encounter (HOSPITAL_COMMUNITY): Payer: Self-pay | Admitting: Orthopedic Surgery
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
## 2023-11-11 ENCOUNTER — Encounter: Payer: Self-pay | Admitting: Internal Medicine

## 2023-11-11 ENCOUNTER — Ambulatory Visit: Admitting: Internal Medicine

## 2023-11-11 VITALS — BP 128/84 | HR 84 | Temp 97.6°F | Ht 71.0 in | Wt 176.0 lb

## 2023-11-11 DIAGNOSIS — Z91010 Allergy to peanuts: Secondary | ICD-10-CM | POA: Insufficient documentation

## 2023-11-11 DIAGNOSIS — Z720 Tobacco use: Secondary | ICD-10-CM | POA: Diagnosis not present

## 2023-11-11 DIAGNOSIS — Z13220 Encounter for screening for lipoid disorders: Secondary | ICD-10-CM | POA: Diagnosis not present

## 2023-11-11 DIAGNOSIS — F419 Anxiety disorder, unspecified: Secondary | ICD-10-CM | POA: Insufficient documentation

## 2023-11-11 DIAGNOSIS — E162 Hypoglycemia, unspecified: Secondary | ICD-10-CM | POA: Diagnosis not present

## 2023-11-11 DIAGNOSIS — R079 Chest pain, unspecified: Secondary | ICD-10-CM | POA: Insufficient documentation

## 2023-11-11 LAB — COMPREHENSIVE METABOLIC PANEL WITH GFR
ALT: 19 U/L (ref 0–53)
AST: 26 U/L (ref 0–37)
Albumin: 4.5 g/dL (ref 3.5–5.2)
Alkaline Phosphatase: 92 U/L (ref 39–117)
BUN: 10 mg/dL (ref 6–23)
CO2: 32 meq/L (ref 19–32)
Calcium: 9.5 mg/dL (ref 8.4–10.5)
Chloride: 102 meq/L (ref 96–112)
Creatinine, Ser: 1.14 mg/dL (ref 0.40–1.50)
GFR: 81.46 mL/min (ref >60.00)
Glucose, Bld: 99 mg/dL (ref 70–99)
Potassium: 4.1 meq/L (ref 3.5–5.1)
Sodium: 140 meq/L (ref 135–145)
Total Bilirubin: 0.4 mg/dL (ref 0.2–1.2)
Total Protein: 7.1 g/dL (ref 6.0–8.3)

## 2023-11-11 LAB — CBC WITH DIFFERENTIAL/PLATELET
Basophils Absolute: 0.1 10*3/uL (ref 0.0–0.1)
Basophils Relative: 0.9 % (ref 0.0–3.0)
Eosinophils Absolute: 0.3 10*3/uL (ref 0.0–0.7)
Eosinophils Relative: 5.3 % — ABNORMAL HIGH (ref 0.0–5.0)
HCT: 43.7 % (ref 39.0–52.0)
Hemoglobin: 14.8 g/dL (ref 13.0–17.0)
Lymphocytes Relative: 30.7 % (ref 12.0–46.0)
Lymphs Abs: 2 10*3/uL (ref 0.7–4.0)
MCHC: 33.8 g/dL (ref 30.0–36.0)
MCV: 89.6 fl (ref 78.0–100.0)
Monocytes Absolute: 0.3 10*3/uL (ref 0.1–1.0)
Monocytes Relative: 4.8 % (ref 3.0–12.0)
Neutro Abs: 3.8 10*3/uL (ref 1.4–7.7)
Neutrophils Relative %: 58.3 % (ref 43.0–77.0)
Platelets: 275 10*3/uL (ref 150.0–400.0)
RBC: 4.88 Mil/uL (ref 4.22–5.81)
RDW: 12.9 % (ref 11.5–15.5)
WBC: 6.5 10*3/uL (ref 4.0–10.5)

## 2023-11-11 LAB — LIPID PANEL
Cholesterol: 181 mg/dL (ref 0–200)
HDL: 33.8 mg/dL — ABNORMAL LOW (ref >39.00)
LDL Cholesterol: 126 mg/dL — ABNORMAL HIGH (ref 0–99)
NonHDL: 146.7
Total CHOL/HDL Ratio: 5
Triglycerides: 105 mg/dL (ref 0.0–149.0)
VLDL: 21 mg/dL (ref 0.0–40.0)

## 2023-11-11 LAB — TSH: TSH: 1.01 u[IU]/mL (ref 0.35–5.50)

## 2023-11-11 MED ORDER — EPINEPHRINE 0.3 MG/0.3ML IJ SOAJ
0.3000 mg | INTRAMUSCULAR | 2 refills | Status: AC | PRN
Start: 2023-11-11 — End: ?

## 2023-11-18 ENCOUNTER — Ambulatory Visit: Payer: Self-pay | Admitting: Internal Medicine

## 2023-11-18 DIAGNOSIS — R079 Chest pain, unspecified: Secondary | ICD-10-CM

## 2023-11-28 ENCOUNTER — Other Ambulatory Visit (HOSPITAL_COMMUNITY)
Admission: RE | Admit: 2023-11-28 | Discharge: 2023-11-28 | Disposition: A | Discharge status: Home / Self Care | Source: Ambulatory Visit | Attending: Internal Medicine | Admitting: Internal Medicine

## 2023-11-28 ENCOUNTER — Ambulatory Visit: Admitting: Internal Medicine

## 2023-11-28 ENCOUNTER — Encounter: Payer: Self-pay | Admitting: Internal Medicine

## 2023-11-28 VITALS — BP 130/86 | HR 63 | Temp 97.7°F | Ht 71.0 in | Wt 180.0 lb

## 2023-11-28 DIAGNOSIS — Z113 Encounter for screening for infections with a predominantly sexual mode of transmission: Secondary | ICD-10-CM | POA: Diagnosis not present

## 2023-11-28 DIAGNOSIS — F32A Depression, unspecified: Secondary | ICD-10-CM

## 2023-11-28 DIAGNOSIS — F419 Anxiety disorder, unspecified: Secondary | ICD-10-CM | POA: Diagnosis not present

## 2023-11-28 DIAGNOSIS — F1910 Other psychoactive substance abuse, uncomplicated: Secondary | ICD-10-CM | POA: Diagnosis not present

## 2023-11-28 MED ORDER — FLUOXETINE HCL 10 MG PO CAPS: 10.0000 mg | ORAL_CAPSULE | Freq: Every day | ORAL | 0 refills | Status: DC
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
## 2023-11-29 LAB — URINE CYTOLOGY ANCILLARY ONLY
Chlamydia: NEGATIVE
Comment: NEGATIVE
Comment: NEGATIVE
Comment: NORMAL
Neisseria Gonorrhea: NEGATIVE
Trichomonas: NEGATIVE

## 2023-11-29 LAB — HIV ANTIBODY (ROUTINE TESTING W REFLEX): HIV 1&2 Ab, 4th Generation: NONREACTIVE

## 2023-11-29 LAB — RPR: RPR Ser Ql: NONREACTIVE

## 2023-11-29 LAB — HEPATITIS C ANTIBODY: Hepatitis C Ab: NONREACTIVE

## 2023-11-30 ENCOUNTER — Ambulatory Visit: Payer: Self-pay | Admitting: Internal Medicine

## 2024-01-11 ENCOUNTER — Encounter: Payer: Self-pay | Admitting: Internal Medicine

## 2024-01-11 ENCOUNTER — Emergency Department (HOSPITAL_COMMUNITY)
Admission: EM | Admit: 2024-01-11 | Discharge: 2024-01-12 | Disposition: A | Discharge status: Home / Self Care | Attending: Emergency Medicine | Admitting: Emergency Medicine

## 2024-01-11 ENCOUNTER — Ambulatory Visit: Admitting: Internal Medicine

## 2024-01-11 VITALS — BP 116/78 | HR 60 | Temp 98.6°F | Ht 71.0 in | Wt 176.6 lb

## 2024-01-11 DIAGNOSIS — F419 Anxiety disorder, unspecified: Secondary | ICD-10-CM | POA: Diagnosis not present

## 2024-01-11 DIAGNOSIS — T63451A Toxic effect of venom of hornets, accidental (unintentional), initial encounter: Secondary | ICD-10-CM | POA: Insufficient documentation

## 2024-01-11 DIAGNOSIS — T7840XA Allergy, unspecified, initial encounter: Secondary | ICD-10-CM

## 2024-01-11 MED ORDER — NICOTINE 21 MG/24HR TD PT24
21.0000 mg | MEDICATED_PATCH | Freq: Once | TRANSDERMAL | Status: DC
  Administered 2024-01-11: 21 mg via TRANSDERMAL
  Filled 2024-01-11: qty 1

## 2024-01-11 MED ORDER — METHYLPREDNISOLONE SODIUM SUCC 125 MG IJ SOLR
INTRAMUSCULAR | Status: AC
  Administered 2024-01-11: 125 mg
  Filled 2024-01-11: qty 2

## 2024-01-11 MED ORDER — FAMOTIDINE IN NACL 20-0.9 MG/50ML-% IV SOLN
INTRAVENOUS | Status: AC
  Administered 2024-01-11: 20 mg
  Filled 2024-01-11: qty 50

## 2024-01-11 MED ORDER — HYDROXYZINE HCL 25 MG PO TABS: 25.0000 mg | ORAL_TABLET | Freq: Three times a day (TID) | ORAL | 0 refills | Status: DC | PRN

## 2024-01-11 MED ORDER — EPINEPHRINE 0.3 MG/0.3ML IJ SOAJ
INTRAMUSCULAR | Status: AC
  Administered 2024-01-11: 0.3 mg
  Filled 2024-01-11: qty 0.3

## 2024-01-11 MED ORDER — DIPHENHYDRAMINE HCL 50 MG/ML IJ SOLN
INTRAMUSCULAR | Status: AC
  Filled 2024-01-11: qty 1

## 2024-01-11 MED ORDER — FLUOXETINE HCL 20 MG PO CAPS: 20.0000 mg | ORAL_CAPSULE | Freq: Every day | ORAL | 1 refills | Status: DC
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
## 2024-01-11 NOTE — ED Provider Notes (Signed)
 [place] EMERGENCY DEPARTMENT AT [facility] Provider Note   CSN: [id number] Arrival date & time: 01/11/24  2148     Patient presents with: Allergic Reaction   Chad [name] is a 38 y.o. male.  {Add pertinent medical, surgical, social history, OB history to YEP:67052} Patient to ED after being stung by a hornet, with sudden onset hives, a sense of throat swelling. No reports no history of hornet/bee allergy. No vomiting.    Allergic Reaction      Prior to Admission medications   Medication Sig Start Date End Date Taking? Authorizing Provider  EPINEPHrine  0.3 mg/0.3 mL IJ SOAJ injection Inject 0.3 mg into the muscle as needed for anaphylaxis. 11/11/23   Billy Knee, FNP  FLUoxetine  (PROZAC ) 20 MG capsule Take 1 capsule (20 mg total) by mouth daily. 01/11/24   Billy Knee, FNP  fluticasone  (FLONASE ) 50 MCG/ACT nasal spray Place 2 sprays into the nose daily as needed for allergies.    [provider]  hydrOXYzine  (ATARAX ) 25 MG tablet Take 1 tablet (25 mg total) by mouth 3 (three) times daily as needed for anxiety. 01/11/24   Billy Knee, FNP    Allergies: Peanut-containing drug products and Peanut-containing drug products    Review of Systems  Updated Vital Signs BP (!) 140/93 (BP Location: Right Arm)   Pulse 78   Temp 97.9 F (36.6 C) (Oral)   Resp 18   SpO2 99%   Physical Exam Vitals and nursing note reviewed.  Constitutional:      Appearance: He is well-developed.  HENT:     Head: Normocephalic.     Mouth/Throat:     Comments: No lip or tongue swelling. Voice normal.  Cardiovascular:     Rate and Rhythm: Normal rate and regular rhythm.     Heart sounds: No murmur heard. Pulmonary:     Effort: Pulmonary effort is normal.     Breath sounds: Normal breath sounds. No stridor. No wheezing, rhonchi or rales.  Abdominal:     General: Bowel sounds are normal.     Palpations: Abdomen is soft.     Tenderness: There is no abdominal  tenderness. There is no guarding or rebound.  Musculoskeletal:        General: Normal range of motion.     Cervical back: Normal range of motion and neck supple.  Skin:    General: Skin is warm and dry.     Comments: Generalized hives  Neurological:     General: No focal deficit present.     Mental Status: He is alert and oriented to person, place, and time.     (all labs ordered are listed, but only abnormal results are displayed) Labs Reviewed - No data to display  EKG: None  Radiology: No results found.  {Document cardiac monitor, telemetry assessment procedure when appropriate:32947} Procedures   Medications Ordered in the ED  diphenhydrAMINE  (BENADRYL ) 50 MG/ML injection (has no administration in time range)  EPINEPHrine  (EPI-PEN) 0.3 mg/0.3 mL injection (has no administration in time range)  famotidine  (PEPCID ) 20-0.9 MG/50ML-% IVPB (has no administration in time range)  methylPREDNISolone  sodium succinate (SOLU-MEDROL ) 125 mg/2 mL injection (has no administration in time range)      {Click here for ABCD2, HEART and other calculators REFRESH Note before signing:1}                              Medical Decision Making This patient  presents to the ED for concern of allergic reaction, this involves an extensive number of treatment options, and is a complaint that carries with it a high risk of complications and morbidity.  The differential diagnosis includes minimal vs mild vs severe allergic reaction with anaphylaxis.    Co morbidities that complicate the patient evaluation  Anxiety, depression   Additional history obtained:  Additional history and/or information obtained from chart review, notable for on chart review, the patient has been prescribed EpiPen  in the past    Lab Tests:  I Ordered, and personally interpreted labs.  The pertinent results include:  n/a    Imaging Studies ordered:  I ordered imaging studies including *** I independently visualized  and interpreted imaging which showed *** I agree with the radiologist interpretation   Cardiac Monitoring:  The patient was maintained on a cardiac monitor.  I personally viewed and interpreted the cardiac monitored which showed an underlying rhythm of: ***   Medicines ordered and prescription drug management:  I ordered medication including ***  for *** Reevaluation of the patient after these medicines showed that the patient {resolved/improved/worsened:23923::improved} I have reviewed the patients home medicines and have made adjustments as needed   Test Considered:  ***   Critical Interventions:  ***   Consultations Obtained:  I requested consultation with the ***,  and discussed lab and imaging findings as well as pertinent plan - they recommend: ***   Problem List / ED Course:  ***   Reevaluation:  After the interventions noted above, I reevaluated the patient and found that they have :{resolved/improved/worsened:23923::improved}   Social Determinants of Health:  ***   Disposition:  After consideration of the diagnostic results and the patients response to treatment, I feel that the patient would benefit from ***.    ***  {Document critical care time when appropriate  Document review of labs and clinical decision tools ie CHADS2VASC2, etc  Document your independent review of radiology images and any outside records  Document your discussion with family members, caretakers and with consultants  Document social determinants of health affecting pt's care  Document your decision making why or why not admission, treatments were needed:32947:::1}   Final diagnoses:  None    ED Discharge Orders     None

## 2024-01-11 NOTE — ED Triage Notes (Addendum)
 Patient stated he was stung by a bald face hornet about 45 mins ago. Patient stated that he feels like his throat is swelling up and its hard for him to breath, itching and burning of his face and the area where he was stung on the right side of his back. Skin red and Hives posterior and anterior.

## 2024-01-12 NOTE — Discharge Instructions (Signed)
 As we discussed, use the EpiPen  you have at home if you have any further concerning symptoms of allergic reaction such as throat swelling, shortness of breath.

## 2024-01-13 ENCOUNTER — Emergency Department (HOSPITAL_COMMUNITY)
Admission: EM | Admit: 2024-01-13 | Discharge: 2024-01-13 | Disposition: A | Discharge status: Home / Self Care | Attending: Emergency Medicine | Admitting: Emergency Medicine

## 2024-01-13 ENCOUNTER — Encounter (HOSPITAL_COMMUNITY): Payer: Self-pay | Admitting: Emergency Medicine

## 2024-01-13 ENCOUNTER — Other Ambulatory Visit: Payer: Self-pay

## 2024-01-13 DIAGNOSIS — T7840XD Allergy, unspecified, subsequent encounter: Secondary | ICD-10-CM | POA: Insufficient documentation

## 2024-01-13 DIAGNOSIS — T63441D Toxic effect of venom of bees, accidental (unintentional), subsequent encounter: Secondary | ICD-10-CM | POA: Diagnosis not present

## 2024-01-13 MED ORDER — FAMOTIDINE 20 MG PO TABS
40.0000 mg | ORAL_TABLET | Freq: Once | ORAL | Status: AC
  Administered 2024-01-13: 40 mg via ORAL
  Filled 2024-01-13: qty 2

## 2024-01-13 MED ORDER — PREDNISONE 50 MG PO TABS: 50.0000 mg | ORAL_TABLET | Freq: Every day | ORAL | 0 refills | Status: AC

## 2024-01-13 MED ORDER — DIPHENHYDRAMINE HCL 25 MG PO CAPS
50.0000 mg | ORAL_CAPSULE | Freq: Once | ORAL | Status: AC
  Administered 2024-01-13: 50 mg via ORAL
  Filled 2024-01-13: qty 2

## 2024-01-13 MED ORDER — PREDNISONE 20 MG PO TABS
60.0000 mg | ORAL_TABLET | Freq: Once | ORAL | Status: AC
  Administered 2024-01-13: 60 mg via ORAL
  Filled 2024-01-13: qty 3

## 2024-01-13 NOTE — ED Provider Notes (Signed)
 [place] EMERGENCY DEPARTMENT AT [facility] Provider Note   CSN: [id number] Arrival date & time: 01/13/24  9179     Patient presents with: Allergic Reaction   Chad [name] is a 38 y.o. male.   The history is provided by the patient and medical records. No language interpreter was used.  Allergic Reaction Presenting symptoms: itching, rash and swelling (around eyes)   Presenting symptoms: no difficulty breathing, no difficulty swallowing and no wheezing   Severity:  Mild Context: insect bite/sting   Relieved by:  Antihistamines, epinephrine  and steroids Worsened by:  Nothing Ineffective treatments:  Antihistamines      Prior to Admission medications   Medication Sig Start Date End Date Taking? Authorizing Provider  Aspirin-Salicylamide-Caffeine (BC HEADACHE POWDER PO) Take 1 packet by mouth as needed (for headaches).    [provider]  EPINEPHrine  0.3 mg/0.3 mL IJ SOAJ injection Inject 0.3 mg into the muscle as needed for anaphylaxis. 11/11/23   Billy Knee, FNP  FLUoxetine  (PROZAC ) 20 MG capsule Take 1 capsule (20 mg total) by mouth daily. 01/11/24   Billy Knee, FNP  fluticasone  (FLONASE ) 50 MCG/ACT nasal spray Place 2 sprays into both nostrils daily as needed for allergies or rhinitis.    [provider]  hydrOXYzine  (ATARAX ) 25 MG tablet Take 1 tablet (25 mg total) by mouth 3 (three) times daily as needed for anxiety. 01/11/24   Billy Knee, FNP    Allergies: Hornet venom and Other    Review of Systems  Constitutional:  Negative for chills, fatigue and fever.  HENT:  Positive for facial swelling (mild around eyes). Negative for congestion and trouble swallowing.   Respiratory:  Negative for cough, chest tightness, shortness of breath and wheezing.   Cardiovascular:  Negative for chest pain, palpitations and leg swelling.  Gastrointestinal:  Negative for constipation, diarrhea, nausea and vomiting.  Musculoskeletal:  Negative for  back pain.  Skin:  Positive for itching and rash. Negative for wound.  Neurological:  Negative for headaches.  Psychiatric/Behavioral:  Negative for agitation and confusion.   All other systems reviewed and are negative.   Updated Vital Signs BP (!) 135/99 (BP Location: Right Arm)   Pulse 63   Temp 97.7 F (36.5 C) (Oral)   Resp 16   SpO2 98%   Physical Exam Vitals and nursing note reviewed.  Constitutional:      General: He is not in acute distress.    Appearance: He is well-developed.  HENT:     Head: Atraumatic.     Comments: Mild swelling around eyes.  No lip or tongue swelling.  No stridor.    Nose: No congestion or rhinorrhea.     Mouth/Throat:     Pharynx: No oropharyngeal exudate or posterior oropharyngeal erythema.  Eyes:     Extraocular Movements: Extraocular movements intact.     Conjunctiva/sclera: Conjunctivae normal.     Pupils: Pupils are equal, round, and reactive to light.  Cardiovascular:     Rate and Rhythm: Normal rate and regular rhythm.     Heart sounds: No murmur heard. Pulmonary:     Effort: Pulmonary effort is normal. No respiratory distress.     Breath sounds: Normal breath sounds. No stridor. No wheezing, rhonchi or rales.  Chest:     Chest wall: No tenderness.  Abdominal:     General: Abdomen is flat.     Palpations: Abdomen is soft.     Tenderness: There is no abdominal tenderness. There is no  guarding or rebound.  Musculoskeletal:        General: Swelling present.     Cervical back: Neck supple. No tenderness.  Skin:    General: Skin is warm and dry.     Capillary Refill: Capillary refill takes less than 2 seconds.     Findings: Erythema and rash present.  Neurological:     General: No focal deficit present.     Mental Status: He is alert.  Psychiatric:        Mood and Affect: Mood normal.     (all labs ordered are listed, but only abnormal results are displayed) Labs Reviewed - No data to display  EKG: None  Radiology: No  results found.   Procedures   Medications Ordered in the ED  predniSONE  (DELTASONE ) tablet 60 mg (has no administration in time range)  diphenhydrAMINE  (BENADRYL ) capsule 50 mg (has no administration in time range)  famotidine  (PEPCID ) tablet 40 mg (has no administration in time range)                                    Medical Decision Making  Chad [name] is a 38 y.o. male with a past medical history significant for GERD, PTSD, and anxiety who presents for recurrent allergic reaction.  According to patient, 2 days ago he was stung on his right back and had anaphylactic reaction.  He reports that initially he present to the emergency department 2 days ago and had to get steroids, antihistamines, and epinephrine  because his face was swelling, his throat was closing, he could not breathe, not tolerating secretions, he had nausea, and diffuse urticarial rash with itching.  He said that after medications he felt much better and was able to go home and take antihistamines.  He reports that overnight he started having the rash and itching return that did not seem to respond to antihistamines last night.  He said that he did not think he received a prescription for steroids to go home with but he did not think it was bad enough to need EpiPen  this morning so he presents for reevaluation.  He now only reports the itching and rash but he reports this is how his other symptoms began before the escalated.  He denies any throat scratching or throat closing and has difficulty breathing or swallowing.  Denies shortness of breath or wheezing.  Denies nausea.  He is concerned that things could escalate.  He denies any new allergens or anything else that could have caused a new reaction aside from the lingering reaction to the sting.  On my exam, no wheezing.  No stridor.  Lungs clear.  He has diffuse urticarial rash on his torso and arms.  Good pulses.  Oropharyngeal exam unremarkable with no significant  tongue or lip swelling.  Some mild swelling around his eyes.  Pupil symmetric and reactive.  Patient otherwise well-appearing.  We did shared vision making conversation about redoing IV steroids and antihistamines we would rather do oral medications as he is not that severe yet.  He would like to do oral steroids and oral antihistamines and we will reassess the patient.  If he is doing better, dissipate discharge with burst of steroids for the next few days and he still has EpiPen 's at home.  Patient agrees with this plan.  Anticipate discharge if he is feeling better in the next hour or 2.  10:23 AM  Patient has now been here for about 2 hours and his symptoms are drastically improving.  Rash is nearly nonvisible.  No further itching.  Face is less swollen.  He is feeling much better.  He would like to go home.  Will print prescription for burst of steroids for the next 2 days and he will take antihistamines for itching.  He still has EpiPen  at home and will follow-up with PCP.  He understand return precautions and follow-up instructions and was discharged in good condition with improving symptoms.     Final diagnoses:  Allergic reaction, subsequent encounter  Bee sting, accidental or unintentional, subsequent encounter    ED Discharge Orders          Ordered    predniSONE  (DELTASONE ) 50 MG tablet  Daily        01/13/24 1024            Clinical Impression: 1. Allergic reaction, subsequent encounter   2. Bee sting, accidental or unintentional, subsequent encounter     Disposition: Discharge  Condition: Good  I have discussed the results, Dx and Tx plan with the pt(& family if present). He/she/they expressed understanding and agree(s) with the plan. Discharge instructions discussed at great length. Strict return precautions discussed and pt &/or family have verbalized understanding of the instructions. No further questions at time of discharge.    New Prescriptions   PREDNISONE   (DELTASONE ) 50 MG TABLET    Take 1 tablet (50 mg total) by mouth daily for 5 days.    Follow Up: Billy Knee, FNP [address] [place] [address] [phone]     [facility] Emergency Department at [facility] [address] [place]   [address] [phone]         Starnisha Batrez, Lonni PARAS, MD 01/13/24 1026

## 2024-01-13 NOTE — ED Triage Notes (Signed)
 Pt came in from home, c/o outbreak worsening hives and swollen bottom lip. Started last night, unrelieved with benadryl . Endorsed not breathing normal. Treated here 2 days ago after stung by bald face hornet.

## 2024-01-13 NOTE — Discharge Instructions (Signed)
 Your history, exam, and evaluation today are consistent with a recurrent allergic reaction worsening from the sting 2 days ago.  With the oral steroids and antihistamines her symptoms continue to improve and we feel you are now safe for discharge home.  Please take the burst of steroids for the next 5 days starting tomorrow and follow-up with your primary doctor.  Please use antihistamines help with itching and keep your EpiPen  on hand.  If any symptoms change or worsen acutely, return to the nearest department.  Please rest and stay hydrated.

## 2024-01-17 ENCOUNTER — Other Ambulatory Visit: Payer: Self-pay

## 2024-01-17 ENCOUNTER — Emergency Department (HOSPITAL_COMMUNITY)
Admission: EM | Admit: 2024-01-17 | Discharge: 2024-01-17 | Disposition: A | Discharge status: Home / Self Care | Attending: Emergency Medicine | Admitting: Emergency Medicine

## 2024-01-17 ENCOUNTER — Ambulatory Visit: Payer: Self-pay

## 2024-01-17 ENCOUNTER — Encounter (HOSPITAL_COMMUNITY): Payer: Self-pay | Admitting: Emergency Medicine

## 2024-01-17 DIAGNOSIS — G4700 Insomnia, unspecified: Secondary | ICD-10-CM | POA: Diagnosis present

## 2024-01-17 DIAGNOSIS — T380X5A Adverse effect of glucocorticoids and synthetic analogues, initial encounter: Secondary | ICD-10-CM | POA: Diagnosis not present

## 2024-01-17 NOTE — Discharge Instructions (Addendum)
 You were seen in the emergency room for inability to sleep after taking prednisone  You should stop taking prednisone  as the bee stings on your back are looking much better Your sleep will improve after you stop taking for sleep tonight you can try over-the-counter aids such as melatonin or benadryl  as directed Return to the emergency department for allergic symptoms follow-up with your primary care doctor in 1 week for reevaluation

## 2024-01-17 NOTE — Telephone Encounter (Signed)
 FYI Only or Action Required?: FYI only for provider.  Patient was last seen in primary care on 01/11/2024 by Billy Knee, FNP.  Called Nurse Triage reporting Insect Bite.  Symptoms began yesterday.  Interventions attempted: Nothing.  Symptoms are: unchanged.  Triage Disposition: See HCP Within 4 Hours (Or PCP Triage) (overriding Call PCP Now), Home Care  Patient/caregiver understands and will follow disposition?: Yes, will follow disposition  Copied from CRM [id number]. Topic: Clinical - Red Word Triage >> Jan 17, 2024  8:04 AM Thersia BROCKS wrote: [facility] that prompted transfer to Nurse Triage: Patient stated he hasn't been feeling good, went to the ER twice last week, he got bit by a hornet and has been having side effects such as chills stated he has been very jittery and yesterday when he was working felt like he was going to pass out 3 different times Reason for Disposition  All other insect bites  [1] Caller has URGENT medicine question about med that primary care doctor (or NP/PA) or specialist prescribed AND [2] triager unable to answer question  Answer Assessment - Initial Assessment Questions 1. TYPE of INSECT: What type of insect was it?      hornet 2. ONSET: When did you get bitten?      6 days ago 3. LOCATION: Where is the insect bite located?      Upper R back 4. REDNESS: Is the area red or pink? If Yes, ask: What size is the area of redness? (inches or cm). When did the redness start?     Redness but not increased in size 5. PAIN: Is there any pain? If Yes, ask: How bad is the pain? (Scale 0-10; or none, mild, moderate, severe)     denies 6. ITCHING: Does it itch? If Yes, ask: How bad is the itch?      denies 7. SWELLING: How big is the swelling? (e.g., inches, cm, or compare to coins)     Same as initially 8. OTHER SYMPTOMS: Do you have any other symptoms?  (e.g., difficulty breathing, fever, hives)     Chills  Answer Assessment - Initial  Assessment Questions 1. NAME of MEDICINE: What medicine(s) are you calling about?     prednisone  2. QUESTION: What is your question? (e.g., double dose of medicine, side effect)     Side effect. States that he has felt jittery since starting the medication. States that he stopped taking the medication yesterday but the jittery feeling has not lessened at all 3. PRESCRIBER: Who prescribed the medicine? Reason: if prescribed by specialist, call should be referred to that group.     ED 4. SYMPTOMS: Do you have any symptoms? If Yes, ask: What symptoms are you having?  How bad are the symptoms (e.g., mild, moderate, severe)     Jittery, anxious,  Protocols used: Insect Bite-A-AH, Medication Question Call-A-AH

## 2024-01-17 NOTE — ED Triage Notes (Signed)
 Pt comes in after receiving script of prednisone  for bee sting over weekend. Did not take today. He has experience insomnia since Saturday.

## 2024-01-17 NOTE — ED Provider Notes (Signed)
 [place] EMERGENCY DEPARTMENT AT [facility] Provider Note   CSN: [id number] Arrival date & time: 01/17/24  1050     Patient presents with: Insomnia   Chad [name] is a 38 y.o. male.  With a history of anxiety and depression who presents to the ED given concern for adverse reaction to medication.  Patient was recently seen in the emergency department for hornet sting to his back.  Was started on prednisone  for symptomatic management of pain and swelling 4 days ago.  At that time has had great difficulty sleeping with reported restlessness and shakiness.  No recurrence of allergic symptoms.  No shortness of breath nausea or vomiting.  He was discharged with EpiPen .  Will    Insomnia       Prior to Admission medications   Medication Sig Start Date End Date Taking? Authorizing Provider  Aspirin-Salicylamide-Caffeine (BC HEADACHE POWDER PO) Take 1 packet by mouth as needed (for headaches).    [provider]  EPINEPHrine  0.3 mg/0.3 mL IJ SOAJ injection Inject 0.3 mg into the muscle as needed for anaphylaxis. 11/11/23   Billy Knee, FNP  FLUoxetine  (PROZAC ) 20 MG capsule Take 1 capsule (20 mg total) by mouth daily. 01/11/24   Billy Knee, FNP  fluticasone  (FLONASE ) 50 MCG/ACT nasal spray Place 2 sprays into both nostrils daily as needed for allergies or rhinitis.    [provider]  hydrOXYzine  (ATARAX ) 25 MG tablet Take 1 tablet (25 mg total) by mouth 3 (three) times daily as needed for anxiety. 01/11/24   Billy Knee, FNP  predniSONE  (DELTASONE ) 50 MG tablet Take 1 tablet (50 mg total) by mouth daily for 5 days. 01/14/24 01/19/24  Tegeler, Lonni PARAS, MD    Allergies: Hornet venom and Other    Review of Systems  Psychiatric/Behavioral:  The patient has insomnia.     Updated Vital Signs BP (!) 145/93 (BP Location: Left Arm)   Pulse 71   Temp 98 F (36.7 C) (Oral)   Resp 16   Ht 5' 11 (1.803 m)   Wt 80.1 kg   SpO2 99%   BMI 24.63  kg/m   Physical Exam Vitals and nursing note reviewed.  HENT:     Head: Normocephalic and atraumatic.     Mouth/Throat:     Mouth: Mucous membranes are moist.     Pharynx: Oropharynx is clear.  Eyes:     Pupils: Pupils are equal, round, and reactive to light.  Cardiovascular:     Rate and Rhythm: Normal rate and regular rhythm.  Pulmonary:     Effort: Pulmonary effort is normal.     Breath sounds: Normal breath sounds.  Abdominal:     Palpations: Abdomen is soft.     Tenderness: There is no abdominal tenderness.  Skin:    General: Skin is warm and dry.     Comments: Faint circular erythema patch over right mid back  Neurological:     Mental Status: He is alert.  Psychiatric:        Mood and Affect: Mood normal.     (all labs ordered are listed, but only abnormal results are displayed) Labs Reviewed - No data to display  EKG: None  Radiology: No results found.   Procedures   Medications Ordered in the ED - No data to display  Medical Decision Making 38 year old male with history as above presenting after adverse reaction to prednisone ..  Has not been able to sleep and is felt very restless since prescribed prednisone  4 days ago.  He no recurrence of allergic symptoms.  Well-appearing on my exam.  Instructed him to discontinue prednisone  as this is a common adverse reaction to prednisone  or any steroid.  He instructed him on over-the-counter sleep aids but suspect his sleep will improve as soon as he stops taking prednisone .  Appropriate for discharge        Final diagnoses:  Insomnia, unspecified type  Adverse effect of corticosteroids, initial encounter    ED Discharge Orders     None          Pamella Ozell LABOR, DO 01/17/24 1233

## 2024-02-07 ENCOUNTER — Other Ambulatory Visit: Payer: Self-pay | Admitting: Internal Medicine

## 2024-02-07 DIAGNOSIS — F419 Anxiety disorder, unspecified: Secondary | ICD-10-CM

## 2024-02-24 ENCOUNTER — Other Ambulatory Visit: Payer: Self-pay | Admitting: Internal Medicine

## 2024-02-24 DIAGNOSIS — F419 Anxiety disorder, unspecified: Secondary | ICD-10-CM

## 2024-04-12 ENCOUNTER — Ambulatory Visit

## 2024-04-12 ENCOUNTER — Ambulatory Visit: Admitting: Internal Medicine

## 2024-04-12 ENCOUNTER — Encounter: Payer: Self-pay | Admitting: Internal Medicine

## 2024-04-12 VITALS — BP 132/92 | HR 72 | Temp 98.6°F | Ht 71.0 in | Wt 183.0 lb

## 2024-04-12 DIAGNOSIS — F419 Anxiety disorder, unspecified: Secondary | ICD-10-CM | POA: Diagnosis not present

## 2024-04-12 DIAGNOSIS — G8929 Other chronic pain: Secondary | ICD-10-CM | POA: Diagnosis not present

## 2024-04-12 DIAGNOSIS — M545 Low back pain, unspecified: Secondary | ICD-10-CM

## 2024-04-12 NOTE — Patient Instructions (Signed)
 Xray results take approximately 1 week to come back, I will be in touch when I receive these.
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
## 2024-04-18 ENCOUNTER — Ambulatory Visit: Payer: Self-pay | Admitting: Internal Medicine

## 2024-04-18 DIAGNOSIS — M4310 Spondylolisthesis, site unspecified: Secondary | ICD-10-CM | POA: Insufficient documentation

## 2024-05-09 ENCOUNTER — Ambulatory Visit (INDEPENDENT_AMBULATORY_CARE_PROVIDER_SITE_OTHER): Admitting: Physical Medicine and Rehabilitation

## 2024-05-09 ENCOUNTER — Encounter: Payer: Self-pay | Admitting: Physical Medicine and Rehabilitation

## 2024-05-09 DIAGNOSIS — M5441 Lumbago with sciatica, right side: Secondary | ICD-10-CM | POA: Diagnosis not present

## 2024-05-09 DIAGNOSIS — M5442 Lumbago with sciatica, left side: Secondary | ICD-10-CM

## 2024-05-09 DIAGNOSIS — M5416 Radiculopathy, lumbar region: Secondary | ICD-10-CM

## 2024-05-09 DIAGNOSIS — M7918 Myalgia, other site: Secondary | ICD-10-CM | POA: Diagnosis not present

## 2024-05-09 DIAGNOSIS — G8929 Other chronic pain: Secondary | ICD-10-CM

## 2024-05-09 NOTE — Progress Notes (Signed)
 Pain Scale   Average Pain 4 Patient advising he has lower back pain radiating to middle of his back, Pain is constant        +Driver, -BT, -Dye Allergies.

## 2024-05-09 NOTE — Patient Instructions (Signed)

## 2024-05-09 NOTE — Progress Notes (Signed)
 Core Outcome Measures Index (COMI) Back Score  Average Pain 8  COMI Score 80 %
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
## 2024-05-25 ENCOUNTER — Encounter: Payer: Self-pay | Admitting: Physical Medicine and Rehabilitation

## 2024-05-30 ENCOUNTER — Telehealth: Payer: Self-pay | Admitting: Physical Medicine and Rehabilitation

## 2024-05-30 NOTE — Telephone Encounter (Signed)
 [facility] called and said the pt was denied and needs to do a peer ro peer or PT. CB#[phone] ext [phone]

## 2024-05-31 ENCOUNTER — Other Ambulatory Visit

## 2024-06-04 ENCOUNTER — Encounter: Payer: Self-pay | Admitting: Physical Medicine and Rehabilitation

## 2024-06-06 ENCOUNTER — Telehealth: Payer: Self-pay | Admitting: Physical Medicine and Rehabilitation

## 2024-06-06 NOTE — Telephone Encounter (Signed)
 Keyera from [facility] called wanting to do a peer to peer or proof of physical therapy. Call back number is [phone]

## 2024-06-07 NOTE — Telephone Encounter (Signed)
 Chad [name] will be doing a CABIN CREW on Mon Dec 22. Pt is aware will need to cancel the Dec 21 appt with [facility] and not to schedule until we get him approved, hopefully.

## 2024-06-07 NOTE — Telephone Encounter (Signed)
 See previous message

## 2024-06-07 NOTE — Telephone Encounter (Signed)
 I called and spoke with pt and advised him we will need to do a peer to peer review and will try to get this done before Sunday Dec 21 because he is scheduled to have MRI at that time. Pt states he has been doing home exercises everyday with stretching, flexing which this is required by his job. Pt is in unbearable pain. I told pt I will try my best to get him approved so he can get the MRI.

## 2024-06-10 ENCOUNTER — Other Ambulatory Visit

## 2024-06-11 NOTE — Telephone Encounter (Signed)
 P2p got denied. Per Duwaine she is going to call the pt to let him know he needs to do 6 weeks of conservative treatment before can proceed with MRI.

## 2024-10-11 ENCOUNTER — Ambulatory Visit: Admitting: Internal Medicine
